# Patient Record
Sex: Male | Born: 1935
Health system: Southern US, Community
[De-identification: ages and names within clinical notes are randomized; demographics above are authoritative.]

## PROBLEM LIST (undated history)

## (undated) DIAGNOSIS — R569 Unspecified convulsions: Secondary | ICD-10-CM

## (undated) DIAGNOSIS — I69359 Hemiplegia and hemiparesis following cerebral infarction affecting unspecified side: Secondary | ICD-10-CM

## (undated) DIAGNOSIS — I639 Cerebral infarction, unspecified: Secondary | ICD-10-CM

## (undated) DIAGNOSIS — E7800 Pure hypercholesterolemia, unspecified: Secondary | ICD-10-CM

## (undated) DIAGNOSIS — E785 Hyperlipidemia, unspecified: Secondary | ICD-10-CM

## (undated) DIAGNOSIS — R269 Unspecified abnormalities of gait and mobility: Secondary | ICD-10-CM

## (undated) DIAGNOSIS — M21372 Foot drop, left foot: Secondary | ICD-10-CM

## (undated) DIAGNOSIS — G459 Transient cerebral ischemic attack, unspecified: Secondary | ICD-10-CM

## (undated) DIAGNOSIS — I69320 Aphasia following cerebral infarction: Secondary | ICD-10-CM

## (undated) DIAGNOSIS — Z8679 Personal history of other diseases of the circulatory system: Secondary | ICD-10-CM

## (undated) DIAGNOSIS — I10 Essential (primary) hypertension: Secondary | ICD-10-CM

## (undated) DIAGNOSIS — C61 Malignant neoplasm of prostate: Secondary | ICD-10-CM

## (undated) DIAGNOSIS — I69391 Dysphagia following cerebral infarction: Secondary | ICD-10-CM

## (undated) DIAGNOSIS — C801 Malignant (primary) neoplasm, unspecified: Secondary | ICD-10-CM

## (undated) HISTORY — PX: TONSILLECTOMY: SUR1361

## (undated) HISTORY — PX: OTHER SURGICAL HISTORY: SHX169

## (undated) HISTORY — DX: Hyperlipidemia, unspecified: E78.5

## (undated) HISTORY — DX: Aphasia following cerebral infarction: I69.320

## (undated) HISTORY — PX: CATARACT EXTRACTION: SUR2

## (undated) HISTORY — PX: CAROTID ENDARTERECTOMY: SUR193

## (undated) HISTORY — DX: Unspecified abnormalities of gait and mobility: R26.9

## (undated) HISTORY — PX: ABDOMINAL HERNIA REPAIR: SHX539

## (undated) HISTORY — DX: Foot drop, left foot: M21.372

## (undated) HISTORY — DX: Malignant neoplasm of prostate: C61

## (undated) HISTORY — DX: Dysphagia following cerebral infarction: I69.391

## (undated) HISTORY — DX: Malignant (primary) neoplasm, unspecified: C80.1

## (undated) HISTORY — DX: Personal history of other diseases of the circulatory system: Z86.79

## (undated) HISTORY — DX: Hemiplegia and hemiparesis following cerebral infarction affecting unspecified side: I69.359

---

## 1998-04-28 ENCOUNTER — Inpatient Hospital Stay (HOSPITAL_COMMUNITY)
Admission: RE | Admit: 1998-04-28 | Discharge: 1998-05-13 | Payer: Self-pay | Admitting: Physical Medicine & Rehabilitation

## 2007-09-06 ENCOUNTER — Ambulatory Visit: Admission: RE | Admit: 2007-09-06 | Discharge: 2007-12-05 | Payer: Self-pay | Admitting: Radiation Oncology

## 2007-10-31 ENCOUNTER — Ambulatory Visit (HOSPITAL_BASED_OUTPATIENT_CLINIC_OR_DEPARTMENT_OTHER): Admission: RE | Admit: 2007-10-31 | Discharge: 2007-10-31 | Payer: Self-pay | Admitting: Urology

## 2010-12-21 NOTE — H&P (Signed)
[name], [name]           ACCOUNT NO.:  [id number]   MEDICAL RECORD NO.:  [id number]          PATIENT TYPE:  AMB   LOCATION:  [facility]                         FACILITY:  [facility]   PHYSICIAN:  Debroah Baller, M.D.     DATE OF BIRTH:  11-01-1928   DATE OF ADMISSION:  10/31/2007  DATE OF DISCHARGE:                              HISTORY & PHYSICAL   This is for surgery March 25th. [name] is a 77 year old white  male with a history of prostate nodule, and he underwent biopsy in  December 2008. The biopsy came back positive on the left show a  Gleason's 3+3 (6 adenocarcinoma in 20% of the tissue). He has been  presented his options in terms of treatment, and he is for definitive  therapy with radioactive seed implantation. The patient underwent  staging with CT scan which did not show any spread. His past history is  significant for hypertension and elevated cholesterol. He is being  treated by Dr. Royal Hawthorn in [place]. Previous surgeries included May 2008  right carotid endarterectomy and then subsequently in July 2008 he had a  basal cell cancer removed from behind his right ear. He had a left  carotid endarterectomy in May 2006. He does not smoke. There is no  family history of prostate cancer. There is of diabetes.   MEDICATIONS:  1. Saw Palmetto.  2. Plavix.  3. Zocor.  4. Lisinopril.  5. Aspirin.  6. Fish oil.  7. Multivitamins.   ALLERGIES:  No known drug allergies.   REVIEW OF SYSTEMS:  Negative for chest pain, shortness of breath. No  dysuria. Bowel movements are regular.   PHYSICAL EXAMINATION:  GENERAL:  Well-developed, well-nourished man in  no acute distress.  VITAL SIGNS:  As recorded.  HEENT:  Head is normal and normocephalic.  NECK:  Supple with full range of motion.  HEART SOUNDS:  Regular rate and rhythm.  LUNGS:  Clear to auscultation.  BACK:  Without CVA tenderness.  ABDOMEN:  Nontender without masses.  GENITOURINARY:  Penis without lesions. Both  testes are descended without  masses. Epididymis is nontender. No external scrotal lesions.  RECTAL:  20 gram prostate with a nodule at the left base. Seminal  vesicles are not palpable. Adequate sphincter tone. No external  hemorrhoids.  EXTREMITIES:  No edema. No calf tenderness.  NEUROLOGIC:  Nonfocal.   PERTINENT LABORATORIES:  Pending at this time. His last PSA was  approximately September 2008 and was 2.73.   IMPRESSION:  Stage T2 prostate cancer.   PLAN:  Proceed with definitive therapy with radioactive seed  implantation. The patient has been explained the risks and benefits. He  understands and wishes to go forward with this.           ______________________________  Debroah Baller, M.D.     RC/MEDQ  D:  10/24/2007  T:  10/24/2007  Job:  [id number]

## 2010-12-21 NOTE — Op Note (Signed)
[name], [name]         ACCOUNT NO.:  [id number]   MEDICAL RECORD NO.:  [id number]          PATIENT TYPE:  AMB   LOCATION:  [facility]                         FACILITY:  [facility]   PHYSICIAN:  Debroah Baller, M.D.     DATE OF BIRTH:  09-Jul-1929   DATE OF PROCEDURE:  DATE OF DISCHARGE:                               OPERATIVE REPORT   PREOPERATIVE DIAGNOSIS:  Prostate cancer.   POSTOPERATIVE DIAGNOSIS:  Prostate cancer.   PROCEDURE:  Transrectal ultrasound of the prostate intraprostatic seed  implantation of I-125 radioactive seeds and cystoscopy.   SURGEONS OF RECORD:  Debroah Baller, M.D.  Maryln Gottron, M.D.   ANESTHESIA:  General.   INDICATIONS FOR PROCEDURE:  The patient is a 77 year old white male with  recent diagnosis of prostate cancer.  The patient was presented his  options and has opted for definitive therapy with radioactive seed  implantation.   PROCEDURE IN DETAIL:  The patient was brought into the operating room  and placed on the table in the supine position.  After adequate general  anesthesia was achieved, a Foley catheter was placed and the patient was  carefully placed in an exaggerated lithotomy using the yellow Fin  stirrups.  The scrotum was then shaved, prepped, and then taped onto the  lower abdomen.  A red rubber catheter was placed into the rectum to  remove excess air and a transrectal ultrasound probe was placed.  The  prostate was scanned and imaging correlated with the identification  grid.  The anchor needles were then placed into the prostate.  The  Nucletron program then had the prostatic images scanned from apex to  seminal vesicles and from lateral border to lateral border.  The  individual images were then reviewed on the computer program and by  myself, Dr. Dayton Scrape, and the physicist, the prostatic borders were  captured into the program and then the program was used to generate the  seed implantation map.  A final review of the seed  placements was done  to assure no over treatment or under treatment of areas.  Once we had  ascertained seed implantation, the procedure commenced.  Bladder neck  was identified with the Foley balloon with contrast in place.  The  needles were then placed from anterior to posterior in the prostate but  to encompass the prostate in its entirety,  they were placed in a  diffuse pattern with no portion being left untreated.  Once the final  seeds were placed, the procedure was terminated.  A pelvic x-ray was  obtained. Foley catheter was then removed and penis reprepped and  draped.  Flexible cystoscopy was performed and this showed the urethra  to be intact and there was no seeds or  spacers noted within the bladder.  Rectal exam was performed and this  did not reveal any foreign bodies in the rectum.  Using sterile  technique, a 16-French Foley catheter was then placed to drain the  bladder.  The patient had tolerated all this well.  He was then awakened  and taken to the recovery room in good condition.  ______________________________  Debroah Baller, M.D.     RC/MEDQ  D:  10/31/2007  T:  10/31/2007  Job:  [id number]   cc:   Maryln Gottron, M.D.  Fax: [phone]

## 2013-07-12 ENCOUNTER — Other Ambulatory Visit: Payer: Self-pay

## 2013-07-12 ENCOUNTER — Encounter (HOSPITAL_COMMUNITY): Payer: Self-pay | Admitting: Emergency Medicine

## 2013-07-12 ENCOUNTER — Inpatient Hospital Stay (HOSPITAL_COMMUNITY): Payer: Medicare Other

## 2013-07-12 ENCOUNTER — Inpatient Hospital Stay (HOSPITAL_COMMUNITY)
Admission: EM | Admit: 2013-07-12 | Discharge: 2013-07-13 | DRG: 069 | Disposition: A | Discharge status: Home / Self Care | Payer: Medicare Other | Attending: Internal Medicine | Admitting: Internal Medicine

## 2013-07-12 ENCOUNTER — Emergency Department (HOSPITAL_COMMUNITY): Payer: Medicare Other

## 2013-07-12 DIAGNOSIS — I959 Hypotension, unspecified: Secondary | ICD-10-CM | POA: Diagnosis present

## 2013-07-12 DIAGNOSIS — Z8673 Personal history of transient ischemic attack (TIA), and cerebral infarction without residual deficits: Secondary | ICD-10-CM

## 2013-07-12 DIAGNOSIS — R404 Transient alteration of awareness: Secondary | ICD-10-CM | POA: Diagnosis present

## 2013-07-12 DIAGNOSIS — G40309 Generalized idiopathic epilepsy and epileptic syndromes, not intractable, without status epilepticus: Secondary | ICD-10-CM | POA: Diagnosis present

## 2013-07-12 DIAGNOSIS — G8190 Hemiplegia, unspecified affecting unspecified side: Secondary | ICD-10-CM | POA: Diagnosis present

## 2013-07-12 DIAGNOSIS — R569 Unspecified convulsions: Secondary | ICD-10-CM

## 2013-07-12 DIAGNOSIS — Z66 Do not resuscitate: Secondary | ICD-10-CM | POA: Diagnosis present

## 2013-07-12 DIAGNOSIS — E785 Hyperlipidemia, unspecified: Secondary | ICD-10-CM | POA: Diagnosis present

## 2013-07-12 DIAGNOSIS — G40909 Epilepsy, unspecified, not intractable, without status epilepticus: Secondary | ICD-10-CM | POA: Diagnosis present

## 2013-07-12 DIAGNOSIS — I10 Essential (primary) hypertension: Secondary | ICD-10-CM | POA: Diagnosis present

## 2013-07-12 DIAGNOSIS — R072 Precordial pain: Secondary | ICD-10-CM

## 2013-07-12 DIAGNOSIS — E7800 Pure hypercholesterolemia, unspecified: Secondary | ICD-10-CM | POA: Diagnosis present

## 2013-07-12 DIAGNOSIS — G459 Transient cerebral ischemic attack, unspecified: Principal | ICD-10-CM | POA: Diagnosis present

## 2013-07-12 DIAGNOSIS — Z79899 Other long term (current) drug therapy: Secondary | ICD-10-CM

## 2013-07-12 DIAGNOSIS — I639 Cerebral infarction, unspecified: Secondary | ICD-10-CM

## 2013-07-12 HISTORY — DX: Unspecified convulsions: R56.9

## 2013-07-12 HISTORY — DX: Pure hypercholesterolemia, unspecified: E78.00

## 2013-07-12 HISTORY — DX: Essential (primary) hypertension: I10

## 2013-07-12 HISTORY — DX: Cerebral infarction, unspecified: I63.9

## 2013-07-12 HISTORY — DX: Transient cerebral ischemic attack, unspecified: G45.9

## 2013-07-12 LAB — PROTIME-INR
INR: 0.98 (ref 0.00–1.49)
Prothrombin Time: 12.8 seconds (ref 11.6–15.2)

## 2013-07-12 LAB — POCT I-STAT, CHEM 8
BUN: 19 mg/dL (ref 6–23)
Calcium, Ion: 1.21 mmol/L (ref 1.13–1.30)
Chloride: 100 mEq/L (ref 96–112)
Creatinine, Ser: 1 mg/dL (ref 0.50–1.35)

## 2013-07-12 LAB — APTT: aPTT: 38 seconds — ABNORMAL HIGH (ref 24–37)

## 2013-07-12 LAB — COMPREHENSIVE METABOLIC PANEL
ALT: 15 U/L (ref 0–53)
AST: 22 U/L (ref 0–37)
Albumin: 4 g/dL (ref 3.5–5.2)
Alkaline Phosphatase: 84 U/L (ref 39–117)
BUN: 18 mg/dL (ref 6–23)
Potassium: 4.7 mEq/L (ref 3.5–5.1)
Sodium: 134 mEq/L — ABNORMAL LOW (ref 135–145)
Total Protein: 7.4 g/dL (ref 6.0–8.3)

## 2013-07-12 LAB — CBC
MCH: 32.8 pg (ref 26.0–34.0)
MCHC: 35.2 g/dL (ref 30.0–36.0)
Platelets: 251 10*3/uL (ref 150–400)
RDW: 12.3 % (ref 11.5–15.5)

## 2013-07-12 LAB — MRSA PCR SCREENING: MRSA by PCR: NEGATIVE

## 2013-07-12 LAB — DIFFERENTIAL
Basophils Absolute: 0 10*3/uL (ref 0.0–0.1)
Basophils Relative: 1 % (ref 0–1)
Eosinophils Absolute: 0.1 10*3/uL (ref 0.0–0.7)
Neutro Abs: 3.8 10*3/uL (ref 1.7–7.7)
Neutrophils Relative %: 64 % (ref 43–77)

## 2013-07-12 LAB — TROPONIN I: Troponin I: <0.3 ng/mL (ref <0.30)

## 2013-07-12 LAB — GLUCOSE, CAPILLARY
Glucose-Capillary: 125 mg/dL — ABNORMAL HIGH (ref 70–99)
Glucose-Capillary: 118 mg/dL — ABNORMAL HIGH (ref 70–99)

## 2013-07-12 MED ORDER — LORAZEPAM 2 MG/ML IJ SOLN
2.0000 mg | Freq: Once | INTRAMUSCULAR | Status: AC
  Administered 2013-07-12: 2 mg via INTRAVENOUS
  Filled 2013-07-12: qty 1

## 2013-07-12 MED ORDER — ONDANSETRON HCL 4 MG PO TABS: 4.0000 mg | ORAL_TABLET | Freq: Four times a day (QID) | ORAL | Status: DC | PRN

## 2013-07-12 MED ORDER — ONDANSETRON HCL 4 MG/2ML IJ SOLN: 4.0000 mg | Freq: Four times a day (QID) | INTRAMUSCULAR | Status: DC | PRN

## 2013-07-12 MED ORDER — INSULIN ASPART 100 UNIT/ML ~~LOC~~ SOLN: 0.0000 [IU] | Freq: Three times a day (TID) | SUBCUTANEOUS | Status: DC

## 2013-07-12 MED ORDER — SODIUM CHLORIDE 0.9 % IV SOLN
500.0000 mg | Freq: Two times a day (BID) | INTRAVENOUS | Status: DC
  Administered 2013-07-12: 500 mg via INTRAVENOUS
  Filled 2013-07-12 (×3): qty 5

## 2013-07-12 MED ORDER — SODIUM CHLORIDE 0.9 % IV SOLN
INTRAVENOUS | Status: DC
  Administered 2013-07-12: 12:00:00 via INTRAVENOUS
  Administered 2013-07-12 – 2013-07-13 (×2): 1000 mL via INTRAVENOUS

## 2013-07-12 MED ORDER — GUAIFENESIN-DM 100-10 MG/5ML PO SYRP
5.0000 mL | ORAL_SOLUTION | ORAL | Status: DC | PRN
  Filled 2013-07-12: qty 5

## 2013-07-12 MED ORDER — POLYETHYLENE GLYCOL 3350 17 G PO PACK
17.0000 g | PACK | Freq: Every day | ORAL | Status: DC | PRN
  Filled 2013-07-12: qty 1

## 2013-07-12 MED ORDER — ALBUTEROL SULFATE (5 MG/ML) 0.5% IN NEBU: 2.5000 mg | INHALATION_SOLUTION | RESPIRATORY_TRACT | Status: DC | PRN

## 2013-07-12 MED ORDER — HEPARIN SODIUM (PORCINE) 5000 UNIT/ML IJ SOLN
5000.0000 [IU] | Freq: Three times a day (TID) | INTRAMUSCULAR | Status: DC
  Administered 2013-07-12 – 2013-07-13 (×4): 5000 [IU] via SUBCUTANEOUS
  Filled 2013-07-12 (×6): qty 1

## 2013-07-12 MED ORDER — ASPIRIN EC 81 MG PO TBEC
81.0000 mg | DELAYED_RELEASE_TABLET | Freq: Every day | ORAL | Status: DC
  Administered 2013-07-12 – 2013-07-13 (×2): 81 mg via ORAL
  Filled 2013-07-12 (×3): qty 1

## 2013-07-12 MED ORDER — SODIUM CHLORIDE 0.9 % IV SOLN
1000.0000 mg | Freq: Once | INTRAVENOUS | Status: AC
  Administered 2013-07-12: 1000 mg via INTRAVENOUS
  Filled 2013-07-12: qty 10

## 2013-07-12 MED ORDER — SODIUM CHLORIDE 0.9 % IV SOLN
Freq: Once | INTRAVENOUS | Status: AC
  Administered 2013-07-12: 11:00:00 via INTRAVENOUS

## 2013-07-12 MED ORDER — SODIUM CHLORIDE 0.9 % IJ SOLN
3.0000 mL | Freq: Two times a day (BID) | INTRAMUSCULAR | Status: DC
  Administered 2013-07-12: 3 mL via INTRAVENOUS

## 2013-07-12 NOTE — ED Notes (Signed)
Per EMS, pt last seen well by spouse at 0730 this am. Symptoms of left sided weakness, facial droop, slurred speech and tremors noticed at 0830. Upon EMS arrival, symptoms still present. Tremors and left sided weakness noted upon ED arrival.

## 2013-07-12 NOTE — ED Notes (Signed)
Admitting MD at bedside.

## 2013-07-12 NOTE — ED Provider Notes (Signed)
CSN: [id number]     Arrival date & time 07/12/13  1009 History   First MD Initiated Contact with Patient 07/12/13 1022     Chief Complaint  Patient presents with  . Code Stroke   (Consider location/radiation/quality/duration/timing/severity/associated sxs/prior Treatment) HPI Comments: 79 yo wm presents to ER with cc of stroke.  Pt t/s to ER via EMS.  Report is pt awoke at 0730 this AM and was at baseline.  At 0830 he developed L sided weakness to UE and LE and developed L sided twitching of face, LUE, and LLE.   He denies HA, CP, SOB, n/v/d, abd pain.    Patient is a 79 y.o. male presenting with Acute Neurological Problem. The history is provided by the patient and the EMS personnel. The history is limited by the condition of the patient.  Cerebrovascular Accident This is a new problem. The current episode started 1 to 2 hours ago. The problem occurs constantly. The problem has not changed since onset.Pertinent negatives include no chest pain, no abdominal pain, no headaches and no shortness of breath. Nothing relieves the symptoms. He has tried nothing for the symptoms.    Past Medical History  Diagnosis Date  . Stroke     04/20/1998  . TIA (transient ischemic attack)   . Seizures     05/2013  . Hypertension   . High cholesterol    History reviewed. No pertinent past surgical history. Family History  Problem Relation Age of Onset  . Heart failure Brother   . Heart failure Mother    History  Substance Use Topics  . Smoking status: Never Smoker   . Smokeless tobacco: Not on file  . Alcohol Use: No    Review of Systems  Respiratory: Negative for shortness of breath.   Cardiovascular: Negative for chest pain.  Gastrointestinal: Negative for abdominal pain.  Neurological: Negative for headaches.    Allergies  Review of patient's allergies indicates not on file.  Home Medications  No current outpatient prescriptions on file. BP 146/73  Pulse 69  Temp(Src) 97.4 F  (36.3 C) (Oral)  Resp 18  Ht 5\' 10"  (1.778 m)  Wt 166 lb 14.2 oz (75.7 kg)  BMI 23.95 kg/m2  SpO2 100% Physical Exam  Nursing note and vitals reviewed. Constitutional: He is oriented to person, place, and time. He appears well-developed. He appears distressed.  HENT:  Head: Normocephalic.  Mouth/Throat: Oropharynx is clear and moist.  Eyes: Conjunctivae and EOM are normal. Right eye exhibits no discharge. Left eye exhibits no discharge.  Neck: Normal range of motion. Neck supple.  Patient has his head turned to the left and difficulty turning it to the right  Cardiovascular: Normal rate and regular rhythm.  Exam reveals no gallop.   Murmur heard. Pulmonary/Chest: Effort normal and breath sounds normal. He has no wheezes. He has no rales.  Abdominal: Soft. He exhibits no mass. There is no tenderness. There is no rebound and no guarding.  Musculoskeletal: Normal range of motion. He exhibits no edema and no tenderness.  Neurological: He is alert and oriented to person, place, and time. He displays tremor. He exhibits abnormal muscle tone. He displays seizure activity. GCS eye subscore is 4. GCS verbal subscore is 5. GCS motor subscore is 4.  Patient presents with left upper extremity, left lower extremity, left face twitching and rhythmic pattern. He is alert and can answer questions. Patient has weakness of left upper and lower extremity and is unable to coordinate purposeful movement.  For thorough neurologic evaluation please see neurology note.  Left-sided twitching thought to be focal seizure which progressed to grand mal seizure at approximately 1020.    ED Course  Procedures (including critical care time) Labs Review Labs Reviewed  APTT - Abnormal; Notable for the following:    aPTT 38 (*)    All other components within normal limits  COMPREHENSIVE METABOLIC PANEL - Abnormal; Notable for the following:    Sodium 134 (*)    Glucose, Bld 101 (*)    GFR calc non Af Amer 77 (*)     GFR calc Af Amer 89 (*)    All other components within normal limits  GLUCOSE, CAPILLARY - Abnormal; Notable for the following:    Glucose-Capillary 125 (*)    All other components within normal limits  POCT I-STAT, CHEM 8 - Abnormal; Notable for the following:    Glucose, Bld 106 (*)    All other components within normal limits  MRSA PCR SCREENING  PROTIME-INR  CBC  DIFFERENTIAL  TROPONIN I  GLUCOSE, CAPILLARY  HEMOGLOBIN A1C  LIPID PANEL  BASIC METABOLIC PANEL  CBC   Imaging Review Ct Head (brain) Wo Contrast  07/12/2013   CLINICAL DATA:  Left-sided facial droop, slurred speech  EXAM: CT HEAD WITHOUT CONTRAST  TECHNIQUE: Contiguous axial images were obtained from the base of the skull through the vertex without intravenous contrast.  COMPARISON:  05/01/2013  FINDINGS: A region of encephalomalacic change projects within the right frontoparietal region consistent an area of chronic MCA distribution infarction. There is diffuse cortical atrophy and areas of low attenuation within the subcortical, deep, and periventricular white matter regions. There is no evidence of acute hemorrhage nor mass effect. Hydrocephalus ex vacuo is appreciated. Punctate area of low attenuation projects along the peripheral base of the left lobe of the cerebellum. An area of low attenuation projects within the base of the right lentiform nucleus region. There is no evidence of a depressed skull fracture. Visualized paranasal sinuses and mastoid air cells are patent.  IMPRESSION: 1. Encephalomalacia change in the right frontoparietal region indicative of an area of prior MCA distribution infarction 2. Chronic and involutional changes with areas of lacunar infarction, chronic base of the right basal ganglia and left cerebellar hemisphere. 3. No evidence of acute abnormalities 4. Dr. Cyril Mourning was informed of these findings via telephone conversation at the time of the interpretation.   Electronically Signed   By: Salome Holmes M.D.   On: 07/12/2013 10:33   Dg Chest Port 1 View  07/12/2013   CLINICAL DATA:  Cough; unresponsive  EXAM: PORTABLE CHEST - 1 VIEW  COMPARISON:  May 01, 2013  FINDINGS: There is no edema or consolidation. Heart size and pulmonary vascularity are normal. No adenopathy. There is atherosclerotic change in the aorta. There is arthropathy in both shoulders.  IMPRESSION: No edema or consolidation.   Electronically Signed   By: Bretta Bang M.D.   On: 07/12/2013 14:01    EKG Interpretation    Date/Time:  Friday July 12 2013 10:30:46 EST Ventricular Rate:  88 PR Interval:  257 QRS Duration: 108 QT Interval:  330 QTC Calculation: 399 R Axis:   44 Text Interpretation:  Sinus rhythm Prolonged PR interval Confirmed by Kirston Luty  MD, Tywanda Rice ([id number]) on 07/12/2013 11:55:30 AM           Results for orders placed during the hospital encounter of 07/12/13  MRSA PCR SCREENING      Result Value Range  MRSA by PCR NEGATIVE  NEGATIVE  PROTIME-INR      Result Value Range   Prothrombin Time 12.8  11.6 - 15.2 seconds   INR 0.98  0.00 - 1.49  APTT      Result Value Range   aPTT 38 (*) 24 - 37 seconds  CBC      Result Value Range   WBC 5.9  4.0 - 10.5 K/uL   RBC 4.51  4.22 - 5.81 MIL/uL   Hemoglobin 14.8  13.0 - 17.0 g/dL   HCT 16.1  09.6 - 04.5 %   MCV 93.3  78.0 - 100.0 fL   MCH 32.8  26.0 - 34.0 pg   MCHC 35.2  30.0 - 36.0 g/dL   RDW 40.9  81.1 - 91.4 %   Platelets 251  150 - 400 K/uL  DIFFERENTIAL      Result Value Range   Neutrophils Relative % 64  43 - 77 %   Neutro Abs 3.8  1.7 - 7.7 K/uL   Lymphocytes Relative 25  12 - 46 %   Lymphs Abs 1.5  0.7 - 4.0 K/uL   Monocytes Relative 8  3 - 12 %   Monocytes Absolute 0.5  0.1 - 1.0 K/uL   Eosinophils Relative 2  0 - 5 %   Eosinophils Absolute 0.1  0.0 - 0.7 K/uL   Basophils Relative 1  0 - 1 %   Basophils Absolute 0.0  0.0 - 0.1 K/uL  COMPREHENSIVE METABOLIC PANEL      Result Value Range   Sodium 134 (*) 135 - 145  mEq/L   Potassium 4.7  3.5 - 5.1 mEq/L   Chloride 98  96 - 112 mEq/L   CO2 26  19 - 32 mEq/L   Glucose, Bld 101 (*) 70 - 99 mg/dL   BUN 18  6 - 23 mg/dL   Creatinine, Ser 7.82  0.50 - 1.35 mg/dL   Calcium 9.7  8.4 - 95.6 mg/dL   Total Protein 7.4  6.0 - 8.3 g/dL   Albumin 4.0  3.5 - 5.2 g/dL   AST 22  0 - 37 U/L   ALT 15  0 - 53 U/L   Alkaline Phosphatase 84  39 - 117 U/L   Total Bilirubin 0.5  0.3 - 1.2 mg/dL   GFR calc non Af Amer 77 (*) >90 mL/min   GFR calc Af Amer 89 (*) >90 mL/min  TROPONIN I      Result Value Range   Troponin I <0.30  <0.30 ng/mL  GLUCOSE, CAPILLARY      Result Value Range   Glucose-Capillary 125 (*) 70 - 99 mg/dL  GLUCOSE, CAPILLARY      Result Value Range   Glucose-Capillary 86  70 - 99 mg/dL  POCT I-STAT, CHEM 8      Result Value Range   Sodium 136  135 - 145 mEq/L   Potassium 4.7  3.5 - 5.1 mEq/L   Chloride 100  96 - 112 mEq/L   BUN 19  6 - 23 mg/dL   Creatinine, Ser 2.13  0.50 - 1.35 mg/dL   Glucose, Bld 086 (*) 70 - 99 mg/dL   Calcium, Ion 5.78  4.69 - 1.30 mmol/L   TCO2 25  0 - 100 mmol/L   Hemoglobin 15.0  13.0 - 17.0 g/dL   HCT 62.9  52.8 - 41.3 %    MDM   1. Seizure   2. H/O: stroke  3. Stroke   4. TIA (transient ischemic attack)   5. Seizures   6. Hypertension   7. High cholesterol    79 yo wm presents via EMS as a code stroke.  Neuro (Dr. Cyril Mourning) at bedside in room 31 after CT.   L sided facial, LUE, LLE twitching noted.    At 1020 L sided twitching progressed to [facility] seizure.  Pt given Ativan 2mg  IV which aborted seizure and loaded with keppra per neurology recommendations.      6:55 PM D/w Critical Care attending who recommended weaning O2 and consult to triad for admission.    CRITICAL CARE Performed by: Redgie Grayer, Gracyn Allor   Total critical care time: 45  Critical care time was exclusive of separately billable procedures and treating other patients.  Critical care was necessary to treat or prevent imminent or  life-threatening deterioration.  Critical care was time spent personally by me on the following activities: development of treatment plan with patient and/or surrogate as well as nursing, discussions with consultants, evaluation of patient's response to treatment, examination of patient, obtaining history from patient or surrogate, ordering and performing treatments and interventions, ordering and review of laboratory studies, ordering and review of radiographic studies, pulse oximetry and re-evaluation of patient's condition.  Case discussed at length with neurology, critical care, and hospitalist.  Pt admitted to hospitalist.  Family updated.  VSS.  Pt had a brief period of hypotension after Keppra given, but this was transient and improved after IVF.     Darlys Gales, MD 07/12/13 [phone]

## 2013-07-12 NOTE — Progress Notes (Signed)
  Echocardiogram 2D Echocardiogram has been performed.  [name] 07/12/2013, 4:13 PM

## 2013-07-12 NOTE — H&P (Signed)
[facility]                                                                                    Patient Demographics  [name], is a 79 y.o. male  MRN: [id number]   DOB - Dec 24, 1928  Admit Date - 07/12/2013  Outpatient Primary MD for the patient is No primary provider on file.   With History of -  Past Medical History  Diagnosis Date  . Stroke     04/20/1998  . TIA (transient ischemic attack)   . Seizures     05/2013  . Hypertension   . High cholesterol       History reviewed. No pertinent past surgical history.  in for   Chief Complaint  Patient presents with  . Code Stroke     HPI  [name]  is a 79 y.o. 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 He was seen in the ER by neurologist on call Dr. Cyril Mourning , patient received some Ativan and Keppra in the ER thereafter became somnolent and unarousable, blood pressure temporarily dropped due to Ativan which came up with IV fluids. Stroke was ruled out and I was called to admit the patient for seizure workup and hypertension.    Review of Systems   Unobtainable patient is somnolent after Ativan and sleeping remains unarousable right now.   Social History History  Substance Use Topics  . Smoking status: Never Smoker   . Smokeless tobacco: Not on file  . Alcohol Use: No      Family History Family History  Problem Relation Age of Onset  . Heart failure Brother   . Heart failure Mother       Prior  to Admission medications   Not on File    Not on File  Physical Exam  Vitals  Blood pressure 100/53, pulse 67, temperature 98.3 F (36.8 C), temperature source Oral, resp. rate 14, SpO2 94.00%.   1. General eldely white male lying in bed somnolent and sleep and after Ativan  2. chronic left-sided hemiparesis but moves left-sided 20s to painful stimuli, moves right-sided extremities to painful stimuli more briskly,  3. No F.N deficits, ALL C.Nerves Intact, Strength 5/5 all 4 extremities, Sensation intact all 4 extremities, Plantars down going.  4. Ears and Eyes appear Normal, Conjunctivae clear, PERRLA. Moist Oral Mucosa.  5. Supple Neck, No JVD, No cervical lymphadenopathy appriciated, No Carotid Bruits.  6. Symmetrical Chest wall movement, Good air movement bilaterally, CTAB.  7. RRR, No Gallops, Rubs or Murmurs, No Parasternal Heave.  8. Positive Bowel Sounds, Abdomen Soft, Non tender, No organomegaly appriciated,No rebound -guarding or rigidity.  9.  No Cyanosis, Normal Skin Turgor,  No Skin Rash or Bruise.  10. Good muscle tone,  joints appear normal , no effusions, Normal ROM.  11. No Palpable Lymph Nodes in Neck or Axillae     Data Review  CBC  Recent Labs Lab 07/12/13 1010 07/12/13 1018  WBC 5.9  --   HGB 14.8 15.0  HCT 42.1 44.0  PLT 251  --   MCV 93.3  --   MCH 32.8  --   MCHC 35.2  --   RDW 12.3  --   LYMPHSABS 1.5  --   MONOABS 0.5  --   EOSABS 0.1  --   BASOSABS 0.0  --    ------------------------------------------------------------------------------------------------------------------  Chemistries   Recent Labs Lab 07/12/13 1010 07/12/13 1018  NA 134* 136  K 4.7 4.7  CL 98 100  CO2 26  --   GLUCOSE 101* 106*  BUN 18 19  CREATININE 0.90 1.00  CALCIUM 9.7  --   AST 22  --   ALT 15  --   ALKPHOS 84  --   BILITOT 0.5  --     ------------------------------------------------------------------------------------------------------------------ CrCl is unknown because there is no height on file for the current visit. ------------------------------------------------------------------------------------------------------------------ No results found for this basename: TSH, T4TOTAL, FREET3, T3FREE, THYROIDAB,  in the last 72 hours   Coagulation profile  Recent Labs Lab 07/12/13 1010  INR 0.98   ------------------------------------------------------------------------------------------------------------------- No results found for this basename: DDIMER,  in the last 72 hours -------------------------------------------------------------------------------------------------------------------  Cardiac Enzymes  Recent Labs Lab 07/12/13 1010  TROPONINI <0.30   ------------------------------------------------------------------------------------------------------------------ No components found with this basename: POCBNP,    ---------------------------------------------------------------------------------------------------------------  Urinalysis No results found for this basename: colorurine, appearanceur, labspec, phurine, glucoseu, hgbur, bilirubinur, ketonesur, proteinur, urobilinogen, nitrite, leukocytesur    ----------------------------------------------------------------------------------------------------------------  Imaging results:   Ct Head (brain) Wo Contrast  07/12/2013   CLINICAL DATA:  Left-sided facial droop, slurred speech  EXAM: CT HEAD WITHOUT CONTRAST  TECHNIQUE: Contiguous axial images were obtained from the base of the skull through the vertex without intravenous contrast.  COMPARISON:  05/01/2013  FINDINGS: A region of encephalomalacic change projects within the right frontoparietal region consistent an area of chronic MCA distribution infarction. There is diffuse cortical atrophy and areas  of low attenuation within the subcortical, deep, and periventricular white matter regions. There is no evidence of acute hemorrhage nor mass effect. Hydrocephalus ex vacuo is appreciated. Punctate area of low attenuation projects along the peripheral base of the left lobe of the cerebellum. An area of low attenuation projects within the base of the right lentiform nucleus region. There is no evidence of a depressed skull fracture. Visualized paranasal sinuses and mastoid air cells are patent.  IMPRESSION: 1. Encephalomalacia change in the right frontoparietal region indicative of an area of prior MCA distribution infarction 2. Chronic and involutional changes with areas of lacunar infarction, chronic base of the right basal ganglia and left cerebellar hemisphere. 3. No evidence of acute abnormalities 4. Dr. Cyril Mourning was informed of these findings via telephone conversation at the time of the interpretation.   Electronically Signed   By: Salome Holmes M.D.   On: 07/12/2013 10:33    My personal review of EKG: Rhythm NSR,  no Acute ST changes    Assessment & Plan    1. Recurrent seizure activity (at least second episode in the last few months) - who has had a large right MCA territory ischemic CVA in the past and was not in any seizure medications, we will do  to step down, frequent neurochecks, repeat MRI to rule out another CVA, EEG, Keppra low given in the ER and will be continued on a daily basis. Neuro following. Seizure and aspiration precautions.   2. Somnolence and hypotension after Ativan in the ER. IV fluid bolus then maintenance, blood pressure is improved, elevate head of the bed, aspiration precautions for now, once wakes up swallow eval and then advance diet as tolerated. Supportive care for now. Avoid future Ativan.   3. Hypertension and dyslipidemia. Home medications currently unknown, pharmacy to reconsult. We'll monitor for now.    4. Questionable history of type 2 diabetes  mellitus. We'll check A1c, sliding scale insulin for now.   DVT Prophylaxis Heparin   AM Labs Ordered, also please review Full Orders  Family Communication: Admission, patients condition and plan of care including tests being ordered have been discussed with the patient and wife who indicate understanding and agree with the plan and Code Status.  Code Status DNR  Likely DC to  TBD  Condition GUARDED   Time spent in minutes : 45    SINGH,PRASHANT K M.D on 07/12/2013 at 11:49 AM  Between 7am to 7pm - Pager - [phone]  After 7pm go to [email or web address] - password [email or web address]  And look for the night coverage person covering me after hours  [facility] Office  [phone]

## 2013-07-12 NOTE — ED Notes (Signed)
Code Stroke Cancelled

## 2013-07-12 NOTE — Code Documentation (Addendum)
79yo male arriving to [facility] via [facility].  EMS reports the patient woke up this morning at 0730 at his baseline and was witnessed to be well by his wife.   At 0830 he had sudden onset tremors as well as left sided weakness, facial droop and slurred speech.  Patient with seizure like movements in left face, arm and leg on arrival.  EDP assessed patient at the bridge and patient taken to CT.  CT completed.  Patient back to room, Dr. Leroy Kennedy at bedside and order given for Ativan 2mg  IVP.  Patient following commands and responding appropriately to questions during assessment.  While ED RN preparing medication, patient had a grand mal seizure and Ativan 2mg  IVP given.  Patient placed on NRB d/t decreased oxygen saturations.  Seizure subsequently stopped, oxygen saturations improved.  Order for Keppra 1gm IV per Dr. Leroy Kennedy, pharmacy made aware that medication needed STAT.  NIHSS not completed d/t medication.  Code stroke cancelled at 1033 per Dr. Leroy Kennedy.  Bedside handoff with ED RN Joni Reining.

## 2013-07-12 NOTE — ED Notes (Signed)
NPA removed after consulting with Dr. Redgie Grayer. O2 sats remain 97% on 2L

## 2013-07-12 NOTE — Progress Notes (Signed)
NEURO HOSPITALIST CONSULT NOTE    Reason for Consult: left sided tremors/twitching.  HPI:                                                                                                                                          [name] is an 79 y.o. male with a past medical history significant for right cortical infarct that was followed by an isolated seizure later on, brought to [facility] ED by ambulance as a code stroke due to acute onset of the above stated symptoms. As per EMS report, patient woke up at 730 this morning and was doing well, but around 830 am his wife noticed droopiness of the left face, then the left arm-face and leg started twitching. Upon arrival to the ED he was still having jerking movements of the left side including left side of his face. CT brain showed no acue abnormality. While in the ED and before receiving 2 mg IV ativan, the left focal motor movements progressed to a generalized convulsion that stopped after administration of IV ativan and 1 gram IV keppra.    No past medical history on file.  No past surgical history on file.  No family history on file.  Social History:  has no tobacco, alcohol, and drug history on file.  Allergies not on file  MEDICATIONS:                                                                                                                     Prior to Admission:  (Not in a hospital admission)   ROS:  UNABLE TO OBTAIN DUE TO MENTAL STATUS.  History obtained from: EMS.  There were no vitals taken for this visit.   Neurologic Examination:                                                                                                      S/p ativan Mental status: sedated now, but before ativan he was able to follow commands appropriately. CN 2-12: pupils 2 mm  bilaterally, reactive to light. No gaze preference. EOM full without nystagmus. Face appears to be symmetric and tongue is midline. Motor: unreliable s/p ativan. Sensory: no tested. Coordination and gait: unable to test. Gait: can not be tested. No meningeal signs.  No results found for this basename: cbc, bmp, coags, chol, tri, ldl, hga1c    Results for orders placed during the hospital encounter of 07/12/13 (from the past 48 hour(s))  CBC     Status: None   Collection Time    07/12/13 10:10 AM      Result Value Range   WBC 5.9  4.0 - 10.5 K/uL   RBC 4.51  4.22 - 5.81 MIL/uL   Hemoglobin 14.8  13.0 - 17.0 g/dL   HCT 16.1  09.6 - 04.5 %   MCV 93.3  78.0 - 100.0 fL   MCH 32.8  26.0 - 34.0 pg   MCHC 35.2  30.0 - 36.0 g/dL   RDW 40.9  81.1 - 91.4 %   Platelets 251  150 - 400 K/uL  DIFFERENTIAL     Status: None   Collection Time    07/12/13 10:10 AM      Result Value Range   Neutrophils Relative % 64  43 - 77 %   Neutro Abs 3.8  1.7 - 7.7 K/uL   Lymphocytes Relative 25  12 - 46 %   Lymphs Abs 1.5  0.7 - 4.0 K/uL   Monocytes Relative 8  3 - 12 %   Monocytes Absolute 0.5  0.1 - 1.0 K/uL   Eosinophils Relative 2  0 - 5 %   Eosinophils Absolute 0.1  0.0 - 0.7 K/uL   Basophils Relative 1  0 - 1 %   Basophils Absolute 0.0  0.0 - 0.1 K/uL  POCT I-STAT, CHEM 8     Status: Abnormal   Collection Time    07/12/13 10:18 AM      Result Value Range   Sodium 136  135 - 145 mEq/L   Potassium 4.7  3.5 - 5.1 mEq/L   Chloride 100  96 - 112 mEq/L   BUN 19  6 - 23 mg/dL   Creatinine, Ser 7.82  0.50 - 1.35 mg/dL   Glucose, Bld 956 (*) 70 - 99 mg/dL   Calcium, Ion 2.13  0.86 - 1.30 mmol/L   TCO2 25  0 - 100 mmol/L   Hemoglobin 15.0  13.0 - 17.0 g/dL   HCT 57.8  46.9 - 62.9 %    Ct Head (brain) Wo Contrast  07/12/2013   CLINICAL DATA:  Left-sided facial droop, slurred speech  EXAM: CT HEAD WITHOUT CONTRAST  TECHNIQUE: Contiguous axial images  were obtained from the base of the skull through  the vertex without intravenous contrast.  COMPARISON:  05/01/2013  FINDINGS: A region of encephalomalacic change projects within the right frontoparietal region consistent an area of chronic MCA distribution infarction. There is diffuse cortical atrophy and areas of low attenuation within the subcortical, deep, and periventricular white matter regions. There is no evidence of acute hemorrhage nor mass effect. Hydrocephalus ex vacuo is appreciated. Punctate area of low attenuation projects along the peripheral base of the left lobe of the cerebellum. An area of low attenuation projects within the base of the right lentiform nucleus region. There is no evidence of a depressed skull fracture. Visualized paranasal sinuses and mastoid air cells are patent.  IMPRESSION: 1. Encephalomalacia change in the right frontoparietal region indicative of an area of prior MCA distribution infarction 2. Chronic and involutional changes with areas of lacunar infarction, chronic base of the right basal ganglia and left cerebellar hemisphere. 3. No evidence of acute abnormalities 4. Dr. Cyril Mourning was informed of these findings via telephone conversation at the time of the interpretation.   Electronically Signed   By: Salome Holmes M.D.   On: 07/12/2013 10:33   Assessment/Plan: 79 y/o brought in with a left focal motor seizure with subsequent generalization. Prior right cortical infarct most likely the culprit, but can not entirely exclude early seizures in the context of new stroke. Recommend: 1) Keppra 500 mg IV BID. 2) EEG. 3) MRI brain. Will follow up.  Wyatt Portela, MD 07/12/2013, 10:38 AM

## 2013-07-12 NOTE — Progress Notes (Signed)
EEG Completed; Results Pending  

## 2013-07-13 ENCOUNTER — Inpatient Hospital Stay (HOSPITAL_COMMUNITY): Payer: Medicare Other

## 2013-07-13 DIAGNOSIS — E7800 Pure hypercholesterolemia, unspecified: Secondary | ICD-10-CM

## 2013-07-13 DIAGNOSIS — I10 Essential (primary) hypertension: Secondary | ICD-10-CM

## 2013-07-13 LAB — LIPID PANEL
Cholesterol: 155 mg/dL (ref 0–200)
HDL: 51 mg/dL (ref >39)
Triglycerides: 91 mg/dL (ref <150)
VLDL: 18 mg/dL (ref 0–40)

## 2013-07-13 LAB — CBC
Hemoglobin: 12.9 g/dL — ABNORMAL LOW (ref 13.0–17.0)
MCHC: 34.9 g/dL (ref 30.0–36.0)
MCV: 93.2 fL (ref 78.0–100.0)
Platelets: 215 10*3/uL (ref 150–400)
RDW: 12.3 % (ref 11.5–15.5)
WBC: 7.3 10*3/uL (ref 4.0–10.5)

## 2013-07-13 LAB — GLUCOSE, CAPILLARY
Glucose-Capillary: 100 mg/dL — ABNORMAL HIGH (ref 70–99)
Glucose-Capillary: 83 mg/dL (ref 70–99)

## 2013-07-13 LAB — BASIC METABOLIC PANEL
BUN: 13 mg/dL (ref 6–23)
Chloride: 103 mEq/L (ref 96–112)
Creatinine, Ser: 0.79 mg/dL (ref 0.50–1.35)
GFR calc Af Amer: >90 mL/min (ref >90)
Glucose, Bld: 92 mg/dL (ref 70–99)
Potassium: 3.6 mEq/L (ref 3.5–5.1)

## 2013-07-13 LAB — HEMOGLOBIN A1C: Mean Plasma Glucose: 120 mg/dL — ABNORMAL HIGH (ref <117)

## 2013-07-13 MED ORDER — FINASTERIDE 5 MG PO TABS
5.0000 mg | ORAL_TABLET | Freq: Every day | ORAL | Status: DC
  Administered 2013-07-13: 5 mg via ORAL
  Filled 2013-07-13: qty 1

## 2013-07-13 MED ORDER — SIMVASTATIN 40 MG PO TABS
40.0000 mg | ORAL_TABLET | Freq: Every day | ORAL | Status: DC
  Administered 2013-07-13: 40 mg via ORAL
  Filled 2013-07-13: qty 1

## 2013-07-13 MED ORDER — CLOPIDOGREL BISULFATE 75 MG PO TABS
75.0000 mg | ORAL_TABLET | Freq: Every day | ORAL | Status: DC
  Filled 2013-07-13: qty 1

## 2013-07-13 MED ORDER — LEVETIRACETAM 500 MG PO TABS
500.0000 mg | ORAL_TABLET | Freq: Two times a day (BID) | ORAL | Status: DC
  Administered 2013-07-13: 500 mg via ORAL
  Filled 2013-07-13 (×2): qty 1

## 2013-07-13 MED ORDER — LEVETIRACETAM 500 MG PO TABS: 500.0000 mg | ORAL_TABLET | Freq: Two times a day (BID) | ORAL | Status: DC

## 2013-07-13 MED ORDER — LOSARTAN POTASSIUM 25 MG PO TABS
25.0000 mg | ORAL_TABLET | Freq: Every day | ORAL | Status: DC
  Administered 2013-07-13: 25 mg via ORAL
  Filled 2013-07-13: qty 1

## 2013-07-13 MED ORDER — PANTOPRAZOLE SODIUM 40 MG PO TBEC
40.0000 mg | DELAYED_RELEASE_TABLET | Freq: Every day | ORAL | Status: DC
  Administered 2013-07-13: 40 mg via ORAL
  Filled 2013-07-13: qty 1

## 2013-07-13 NOTE — Discharge Summary (Signed)
[facility]                                                                                   [name], is a 79 y.o. male  DOB Sep 20, 1928  MRN [id number].  Admission date:  07/12/2013  Admitting Physician  Leroy Sea, MD  Discharge Date:  07/13/2013   Primary MD  No primary provider on file.  Recommendations for primary care physician for things to follow:   Follow clinically and make sure patient follows with recommended neurology group   Admission Diagnosis  TIA (transient ischemic attack) [435.9] Seizure [780.39] Seizures [780.39] Hypertension [401.9] High cholesterol [272.0] Stroke [434.91] H/O: stroke [V12.54]  Discharge Diagnosis grand mal  seizure  Active Problems:   Stroke   TIA (transient ischemic attack)   Seizures   Hypertension   High cholesterol   Seizure      Past Medical History  Diagnosis Date  . Stroke     04/20/1998  . TIA (transient ischemic attack)   . Seizures     05/2013  . Hypertension   . High cholesterol     History reviewed. No pertinent past surgical history.   Discharge Condition: Stable   Follow-up Information   Follow up with Pcp Not In System. Schedule an appointment as soon as possible for a visit in 1 week.      Follow up with [facility]. Schedule an appointment as soon as possible for a visit in 1 week.   Contact information:   [address] [place] [address] [phone]        Consults obtained - neurology   Discharge Medications      Medication List         aspirin EC 81 MG tablet  Take 81 mg by mouth daily.     BIOTIN PO  Take 1 tablet by mouth daily.     clopidogrel 75 MG tablet  Commonly known as:  PLAVIX  Take 75 mg by mouth daily with breakfast.     finasteride 5 MG tablet  Commonly known as:  PROSCAR  Take 5 mg by mouth daily.     levETIRAcetam 500 MG tablet  Commonly known as:  KEPPRA  Take 1 tablet (500 mg total) by mouth 2 (two)  times daily.     losartan 50 MG tablet  Commonly known as:  COZAAR  Take 25 mg by mouth daily.     multivitamin with minerals Tabs tablet  Take 1 tablet by mouth daily.     OMEGA 3 PO  Take 1 capsule by mouth daily.     pantoprazole 40 MG tablet  Commonly known as:  PROTONIX  Take 40 mg by mouth daily.     pravastatin 20 MG tablet  Commonly known as:  PRAVACHOL  Take 20 mg by mouth at bedtime.     VITAMIN D PO  Take 1 tablet by mouth daily.     VITAMIN E PO  Take 1 capsule by mouth daily.         Diet and Activity recommendation: See Discharge Instructions below   Discharge Instructions  Follow with Primary MD Dr Wynonia Hazard in 7 days   Do not drive and provide baby sitting services until you have seen the Neurologist and advised to do so again.  Get CBC, CMP, checked 7 days by Primary MD and again as instructed by your Primary MD. Get a 2 view Chest X ray done next visit if you had Pneumonia of Lung problems at the Hospital.  Get Medicines reviewed and adjusted.  Please request your Prim.MD to go over all Hospital Tests and Procedure/Radiological results at the follow up, please get all Hospital records sent to your Prim MD by signing hospital release before you go home.  Activity: As tolerated with Full fall precautions use walker/cane & assistance as needed   Diet: Heart Healthy  For Heart failure patients - Check your Weight same time everyday, if you gain over 2 pounds, or you develop in leg swelling, experience more shortness of breath or chest pain, call your Primary MD immediately. Follow Cardiac Low Salt Diet and 1.8 lit/day fluid restriction.  Disposition Home    If you experience worsening of your admission symptoms, develop shortness of breath, life threatening emergency, suicidal or homicidal thoughts you must seek medical attention immediately by calling 911 or calling your MD immediately  if symptoms less severe.  You Must read complete  instructions/literature along with all the possible adverse reactions/side effects for all the Medicines you take and that have been prescribed to you. Take any new Medicines after you have completely understood and accpet all the possible adverse reactions/side effects.   Do not drive when taking Pain medications.    Do not take more than prescribed Pain, Sleep and Anxiety Medications  Special Instructions: If you have smoked or chewed Tobacco  in the last 2 yrs please stop smoking, stop any regular Alcohol  and or any Recreational drug use.  Wear Seat belts while driving.   Please note  You were cared for by a hospitalist during your hospital stay. If you have any questions about your discharge medications or the care you received while you were in the hospital after you are discharged, you can call the unit and asked to speak with the hospitalist on call if the hospitalist that took care of you is not available. Once you are discharged, your primary care physician will handle any further medical issues. Please note that NO REFILLS for any discharge medications will be authorized once you are discharged, as it is imperative that you return to your primary care physician (or establish a relationship with a primary care physician if you do not have one) for your aftercare needs so that they can reassess your need for medications and monitor your lab values.   Major procedures and Radiology Reports - PLEASE review detailed and final reports for all details, in brief -   EEG  Impression: this is a normal awake and drowsy EEG. Please, be aware that a normal EEG does not exclude the possibility of epilepsy    Echo - Left ventricle: The cavity size was normal. Wall thickness was normal. Systolic function was normal. The estimated ejection fraction was in the range of 60% to 65%. Wall motion was normal; there were no regional wall motion abnormalities. There was an increased  relative contribution of atrial contraction to ventricular filling. - Left atrium: The atrium was mildly to moderately dilated.     Ct Head (brain) Wo Contrast  07/12/2013   CLINICAL DATA:  Left-sided facial droop, slurred speech  EXAM: CT HEAD WITHOUT CONTRAST  TECHNIQUE: Contiguous axial images were obtained from the base of the skull through the vertex without intravenous contrast.  COMPARISON:  05/01/2013  FINDINGS: A region of encephalomalacic change projects within the right frontoparietal region consistent an area of chronic MCA distribution infarction. There is diffuse cortical atrophy and areas of low attenuation within the subcortical, deep, and periventricular white matter regions. There is no evidence of acute hemorrhage nor mass effect. Hydrocephalus ex vacuo is appreciated. Punctate area of low attenuation projects along the peripheral base of the left lobe of the cerebellum. An area of low attenuation projects within the base of the right lentiform nucleus region. There is no evidence of a depressed skull fracture. Visualized paranasal sinuses and mastoid air cells are patent.  IMPRESSION: 1. Encephalomalacia change in the right frontoparietal region indicative of an area of prior MCA distribution infarction 2. Chronic and involutional changes with areas of lacunar infarction, chronic base of the right basal ganglia and left cerebellar hemisphere. 3. No evidence of acute abnormalities 4. Dr. Cyril Mourning was informed of these findings via telephone conversation at the time of the interpretation.   Electronically Signed   By: Salome Holmes M.D.   On: 07/12/2013 10:33   Mr Brain Wo Contrast  07/13/2013   CLINICAL DATA:  History of prior stroke and seizures. Recent possible seizure. Left-sided facial droop. Slurred speech. High blood pressure hypertension.  EXAM: MRI HEAD WITHOUT CONTRAST  TECHNIQUE: Multiplanar, multiecho pulse sequences of the brain and surrounding structures were obtained  without intravenous contrast.  COMPARISON:  08/01/2013 CT.  No comparison brain MR.  FINDINGS: No acute infarct.  Remote right posterior frontal-parietal lobe moderate to large infarct with encephalomalacia and subsequent dilation of the right lateral ventricle. This involved a portion of the right thalamus.  Remote cerebellar infarcts bilaterally.  Remote small left frontal and parietal lobe infarct.  Small vessel disease type changes.  Atrophy. Ventricular prominence most consistent with result of atrophy and prior infarcts rather than hydrocephalus.  No intracranial hemorrhage.  No intracranial mass lesion noted on this unenhanced exam.  Mild transverse ligament hypertrophy. Cervical medullary junction, pituitary region, pineal region and orbital structures unremarkable.  Atherosclerotic type changes of the vertebral arteries and basilar artery suspected. Motion somewhat limits evaluation. Internal carotid arteries are patent bilaterally.  Minimal paranasal sinus mucosal thickening.  IMPRESSION: No acute infarct.  Remote infarcts, small vessel disease type changes and atrophy as noted above.  Question narrowing of vertebral arteries and basilar artery.   Electronically Signed   By: Bridgett Larsson M.D.   On: 07/13/2013 14:29   Dg Chest Port 1 View  07/12/2013   CLINICAL DATA:  Cough; unresponsive  EXAM: PORTABLE CHEST - 1 VIEW  COMPARISON:  May 01, 2013  FINDINGS: There is no edema or consolidation. Heart size and pulmonary vascularity are normal. No adenopathy. There is atherosclerotic change in the aorta. There is arthropathy in both shoulders.  IMPRESSION: No edema or consolidation.   Electronically Signed   By: Bretta Bang M.D.   On: 07/12/2013 14:01    Micro Results      Recent Results (from the past 240 hour(s))  MRSA PCR SCREENING     Status: None   Collection Time    07/12/13 12:50 PM      Result Value Range Status   MRSA by PCR NEGATIVE  NEGATIVE Final   Comment:            The  GeneXpert  MRSA Assay (FDA     approved for NASAL specimens     only), is one component of a     comprehensive MRSA colonization     surveillance program. It is not     intended to diagnose MRSA     infection nor to guide or     monitor treatment for     MRSA infections.     History of present illness and  Hospital Course:     Kindly see H&P for history of present illness and admission details, please review complete Labs, Consult reports and Test reports for all details in brief [name], is a 79 y.o. male, history of right MCA territory ischemic CVA in the past causing dense left-sided hemiparesis uses a walker and assistance to walk at home, seizure with last seizure being a few months ago not on any anti-seizure medications, hypertension, dyslipidemia, who lives at home with his wife and wasn't his usual state of health until this morning when he initially had some facial twitching followed by generalized tonic-clonic activity suggestive of seizures.    Stroke was ruled out with nonacute CT scan and MRI brain, EEG was stable, echogram was nonacute, he was seen by neurologist Dr. Cyril Mourning followed by the neurologist on call over the weekend, after initial treatment with IV Keppra and Ativan patient gradually recovered and came back to his baseline and remained seizure free, he is now back to his baseline with chronic left-sided deficit due to previous CVA but no other new complaints. He will be placed on Keppra and discharged home on his home medications unchanged. He is been advised to follow with his primary care physician and neurology. Has been advised not to drive or provide babysitting services till cleared by neurologist. Plan was explained to the wife in detail.      Today   Subjective:   [name] today has no headache,no chest abdominal pain,no new weakness tingling or numbness, feels much better wants to go home today.   Objective:   Blood pressure 147/54,  pulse 61, temperature 97.8 F (36.6 C), temperature source Oral, resp. rate 18, height 5\' 10"  (1.778 m), weight 76.6 kg (168 lb 14 oz), SpO2 99.00%.   Intake/Output Summary (Last 24 hours) at 07/13/13 1707 Last data filed at 07/13/13 0900  Gross per 24 hour  Intake   1533 ml  Output   2100 ml  Net   -567 ml    Exam Awake Alert, Oriented *3, No new F.N deficits, Normal affect, Chr L sided weakness .AT,PERRAL Supple Neck,No JVD, No cervical lymphadenopathy appriciated.  Symmetrical Chest wall movement, Good air movement bilaterally, CTAB RRR,No Gallops,Rubs or new Murmurs, No Parasternal Heave +ve B.Sounds, Abd Soft, Non tender, No organomegaly appriciated, No rebound -guarding or rigidity. No Cyanosis, Clubbing or edema, No new Rash or bruise  Data Review   CBC w Diff: Lab Results  Component Value Date   WBC 7.3 07/13/2013   HGB 12.9* 07/13/2013   HCT 37.0* 07/13/2013   PLT 215 07/13/2013   LYMPHOPCT 25 07/12/2013   MONOPCT 8 07/12/2013   EOSPCT 2 07/12/2013   BASOPCT 1 07/12/2013    CMP: Lab Results  Component Value Date   NA 137 07/13/2013   K 3.6 07/13/2013   CL 103 07/13/2013   CO2 26 07/13/2013   BUN 13 07/13/2013   CREATININE 0.79 07/13/2013   PROT 7.4 07/12/2013   ALBUMIN 4.0 07/12/2013   BILITOT 0.5 07/12/2013   ALKPHOS  84 07/12/2013   AST 22 07/12/2013   ALT 15 07/12/2013  .  Lab Results  Component Value Date   HGBA1C 5.8* 07/13/2013    Lab Results  Component Value Date   CHOL 155 07/13/2013   HDL 51 07/13/2013   LDLCALC 86 07/13/2013   TRIG 91 07/13/2013   CHOLHDL 3.0 07/13/2013     Total Time in preparing paper work, data evaluation and todays exam - 35 minutes  Leroy Sea M.D on 07/13/2013 at 5:07 PM  [facility] Office  [phone]

## 2013-07-13 NOTE — Progress Notes (Signed)
   CARE MANAGEMENT NOTE 07/13/2013  Patient:  [name], [name]   Account Number:  [id number]  Date Initiated:  07/12/2013  Documentation initiated by:  MAYO,HENRIETTA  Subjective/Objective Assessment:   adm with dx of seizures; lives with spouse     Action/Plan:   Anticipated DC Date:     Anticipated DC Plan:        DC Planning Services  CM consult      Choice offered to / List presented to:             Status of service:  Completed, signed off Medicare Important Message given?   (If response is "NO", the following Medicare IM given date fields will be blank) Date Medicare IM given:   Date Additional Medicare IM given:    Discharge Disposition:  HOME W HOME HEALTH SERVICES  Per UR Regulation:  Reviewed for med. necessity/level of care/duration of stay  If discussed at Long Length of Stay Meetings, dates discussed:    Comments:  07/13/13 17:50 Cm spoke with Kathie Rhodes concerning choice for her husband's HHPT.  [facility] was chosen for HHPT.  No DME recc. Address and contact number was verified.  Referral faxed to [facility].  No other CM needs were communicated.  Freddy Jaksch, BSN, CM [phone].  Contact:  Earnest Rosier, Spouse  [phone]

## 2013-07-13 NOTE — Progress Notes (Signed)
[facility]                                                                                Patient Demographics  [name], is a 79 y.o. male, DOB - [birth year], OZH:[id number]  Admit date - 07/12/2013   Admitting Physician Micheal Sea, MD  Outpatient Primary MD for the patient is No primary provider on file.  LOS - 1   Chief Complaint  Patient presents with  . Code Stroke        Assessment & Plan    1. Recurrent seizure activity (at least second episode in the last few months) - who has had a large right MCA territory ischemic CVA in the past and was not in any seizure medications- CT head negative, e.g. stable, MRI brain pending, he is seizure free on Keppra which will be continued. Neuro following. Seizure and aspiration precautions. Much improved.     2. Somnolence and hypotension after Ativan in the ER. Resolved after IV fluids avoid Ativan if possible. He is back to baseline.    3. Hypertension and dyslipidemia. Home medications resumed.     4. Questionable history of type 2 diabetes mellitus. Pending A1c, sliding scale insulin for now.    No results found for this basename: HGBA1C    CBG (last 3)   Recent Labs  07/12/13 1037 07/12/13 1823 07/12/13 2208  GLUCAP 125* 86 118*       Code Status: Full  Family Communication: wife  Disposition Plan: Home   Procedures CT Head, MRI Brain, EEG   Consults  Neuro   Medications  Scheduled Meds: . aspirin EC  81 mg Oral Daily  . [START ON 07/14/2013] clopidogrel  75 mg Oral Q breakfast  . finasteride  5 mg Oral Daily  . heparin  5,000 Units Subcutaneous Q8H  . insulin aspart  0-9 Units Subcutaneous TID WC  . levETIRAcetam  500 mg Oral BID  . pantoprazole  40 mg Oral Daily  . simvastatin  40 mg Oral q1800  . sodium chloride  3 mL Intravenous Q12H   Continuous Infusions:  PRN Meds:.albuterol, guaiFENesin-dextromethorphan, ondansetron (ZOFRAN) IV, ondansetron, polyethylene  glycol  DVT Prophylaxis    Heparin   Lab Results  Component Value Date   PLT 215 07/13/2013    Antibiotics     Anti-infectives   None          Subjective:   [name] today has, No headache, No chest pain, No abdominal pain - No Nausea, No new weakness tingling or numbness, No Cough - SOB.   Objective:   Filed Vitals:   07/13/13 0430 07/13/13 0700 07/13/13 0800 07/13/13 0859  BP: 106/76 146/86 133/66 140/60  Pulse: 63 62    Temp: 98.2 F (36.8 C)  98 F (36.7 C)   TempSrc: Axillary  Axillary   Resp: 14 15 12    Height:      Weight: 76.6 kg (168 lb 14 oz)     SpO2:  93% 100% 96%    Wt Readings from Last 3 Encounters:  07/13/13 76.6 kg (168 lb 14 oz)     Intake/Output Summary (Last 24 hours) at 07/13/13  0940 Last data filed at 07/13/13 0900  Gross per 24 hour  Intake   1588 ml  Output   2300 ml  Net   -712 ml    Exam Awake Alert, Oriented X 3, No new F.N deficits, Normal affect, Chr L sided hemiperesis [place].AT,PERRAL Supple Neck,No JVD, No cervical lymphadenopathy appriciated.  Symmetrical Chest wall movement, Good air movement bilaterally, CTAB RRR,No Gallops,Rubs or new Murmurs, No Parasternal Heave +ve B.Sounds, Abd Soft, Non tender, No organomegaly appriciated, No rebound - guarding or rigidity. No Cyanosis, Clubbing or edema, No new Rash or bruise      Data Review   Micro Results Recent Results (from the past 240 hour(s))  MRSA PCR SCREENING     Status: None   Collection Time    07/12/13 12:50 PM      Result Value Range Status   MRSA by PCR NEGATIVE  NEGATIVE Final   Comment:            The GeneXpert MRSA Assay (FDA     approved for NASAL specimens     only), is one component of a     comprehensive MRSA colonization     surveillance program. It is not     intended to diagnose MRSA     infection nor to guide or     monitor treatment for     MRSA infections.    Radiology Reports Ct Head (brain) Wo Contrast  07/12/2013    CLINICAL DATA:  Left-sided facial droop, slurred speech  EXAM: CT HEAD WITHOUT CONTRAST  TECHNIQUE: Contiguous axial images were obtained from the base of the skull through the vertex without intravenous contrast.  COMPARISON:  05/01/2013  FINDINGS: A region of encephalomalacic change projects within the right frontoparietal region consistent an area of chronic MCA distribution infarction. There is diffuse cortical atrophy and areas of low attenuation within the subcortical, deep, and periventricular white matter regions. There is no evidence of acute hemorrhage nor mass effect. Hydrocephalus ex vacuo is appreciated. Punctate area of low attenuation projects along the peripheral base of the left lobe of the cerebellum. An area of low attenuation projects within the base of the right lentiform nucleus region. There is no evidence of a depressed skull fracture. Visualized paranasal sinuses and mastoid air cells are patent.  IMPRESSION: 1. Encephalomalacia change in the right frontoparietal region indicative of an area of prior MCA distribution infarction 2. Chronic and involutional changes with areas of lacunar infarction, chronic base of the right basal ganglia and left cerebellar hemisphere. 3. No evidence of acute abnormalities 4. Dr. Cyril Mourning was informed of these findings via telephone conversation at the time of the interpretation.   Electronically Signed   By: Salome Holmes M.D.   On: 07/12/2013 10:33   Dg Chest Port 1 View  07/12/2013   CLINICAL DATA:  Cough; unresponsive  EXAM: PORTABLE CHEST - 1 VIEW  COMPARISON:  May 01, 2013  FINDINGS: There is no edema or consolidation. Heart size and pulmonary vascularity are normal. No adenopathy. There is atherosclerotic change in the aorta. There is arthropathy in both shoulders.  IMPRESSION: No edema or consolidation.   Electronically Signed   By: Bretta Bang M.D.   On: 07/12/2013 14:01    CBC  Recent Labs Lab 07/12/13 1010 07/12/13 1018  07/13/13 0515  WBC 5.9  --  7.3  HGB 14.8 15.0 12.9*  HCT 42.1 44.0 37.0*  PLT 251  --  215  MCV 93.3  --  93.2  MCH 32.8  --  32.5  MCHC 35.2  --  34.9  RDW 12.3  --  12.3  LYMPHSABS 1.5  --   --   MONOABS 0.5  --   --   EOSABS 0.1  --   --   BASOSABS 0.0  --   --     Chemistries   Recent Labs Lab 07/12/13 1010 07/12/13 1018 07/13/13 0515  NA 134* 136 137  K 4.7 4.7 3.6  CL 98 100 103  CO2 26  --  26  GLUCOSE 101* 106* 92  BUN 18 19 13   CREATININE 0.90 1.00 0.79  CALCIUM 9.7  --  8.8  AST 22  --   --   ALT 15  --   --   ALKPHOS 84  --   --   BILITOT 0.5  --   --    ------------------------------------------------------------------------------------------------------------------ estimated creatinine clearance is 73.5 ml/min (by C-G formula based on Cr of 0.79). ------------------------------------------------------------------------------------------------------------------ No results found for this basename: HGBA1C,  in the last 72 hours ------------------------------------------------------------------------------------------------------------------  Recent Labs  07/13/13 0515  CHOL 155  HDL 51  LDLCALC 86  TRIG 91  CHOLHDL 3.0   ------------------------------------------------------------------------------------------------------------------ No results found for this basename: TSH, T4TOTAL, FREET3, T3FREE, THYROIDAB,  in the last 72 hours ------------------------------------------------------------------------------------------------------------------ No results found for this basename: VITAMINB12, FOLATE, FERRITIN, TIBC, IRON, RETICCTPCT,  in the last 72 hours  Coagulation profile  Recent Labs Lab 07/12/13 1010  INR 0.98    No results found for this basename: DDIMER,  in the last 72 hours  Cardiac Enzymes  Recent Labs Lab 07/12/13 1010  TROPONINI <0.30    ------------------------------------------------------------------------------------------------------------------ No components found with this basename: POCBNP,      Time Spent in minutes   35   SINGH,PRASHANT K M.D on 07/13/2013 at 9:40 AM  Between 7am to 7pm - Pager - [phone]  After 7pm go to [email or web address] - password [email or web address]  And look for the night coverage person covering for me after hours  [facility] Office  [phone]

## 2013-07-13 NOTE — Procedures (Signed)
EEG report.  Brief clinical history: 79 y/o brought in with a left focal motor seizure with subsequent generalization.   Technique: this is a 17 channel routine scalp EEG performed at the bedside with bipolar and monopolar montages arranged in accordance to the international 10/20 system of electrode placement. One channel was dedicated to EKG recording.  The study was performed during wakefulness and drowsiness. No activating procedures performed.  Description:In the wakeful state, the best background consisted of a medium amplitude, posterior dominant, well sustained, symmetric and reactive 10 Hz rhythm. Drowsiness demonstrated dropout of the alpha rhythm. No focal or generalized epileptiform discharges noted.  No slowing seen.  EKG showed sinus rhythm.  Impression: this is a normal awake and drowsy EEG. Please, be aware that a normal EEG does not exclude the possibility of epilepsy.  Clinical correlation is advised.  Wyatt Portela, MD

## 2013-07-13 NOTE — Evaluation (Signed)
Speech Language Pathology Evaluation Patient Details Name: [name] MRN: [id number] DOB: 1928-08-14 Today's Date: 07/13/2013 Time: 1125-1140 SLP Time Calculation (min): 15 min  Problem List:  Patient Active Problem List   Diagnosis Date Noted  . Seizure 07/12/2013  . Stroke   . TIA (transient ischemic attack)   . Seizures   . Hypertension   . High cholesterol    Past Medical History:  Past Medical History  Diagnosis Date  . Stroke     04/20/1998  . TIA (transient ischemic attack)   . Seizures     05/2013  . Hypertension   . High cholesterol    Past Surgical History: History reviewed. No pertinent past surgical history. HPI:  Patient is an 79 yo male admitted with seizure activity and to r/o new CVA. No acute CVA. Patient with h/o Rt CVA with Lt hemiparesis, HTN.    Assessment / Plan / Recommendation Clinical Impression  Evaluation complete. Patient present with residual dysarthria as only deficit which he reports is new in nature. Did not report dysarthria from previous CVA. Educated patient on speech intelligibilty strategies which he verbalized understanding of. Note MRI pending. If MRI negative for acute infact, suspect mild residual dysarthria s/p recent seizure. If positive for acute CVA, SLP will f/u for treatment and to establish d/c plan for f/u. Will f/u via chart review 12/8.     SLP Assessment   (TBD pending MRI results)    Follow Up Recommendations   (tbd)    Frequency and Duration        Pertinent Vitals/Pain none   SLP Goals  SLP Goals Progress/Goals/Alternative treatment plan discussed with pt/caregiver and they: Agree  SLP Evaluation Prior Functioning  Cognitive/Linguistic Baseline: Within functional limits Type of Home: House  Lives With: Spouse Available Help at Discharge: Family;Available 24 hours/day   Cognition  Overall Cognitive Status: Within Functional Limits for tasks assessed Orientation Level: Oriented X4     Comprehension  Auditory Comprehension Overall Auditory Comprehension: Appears within functional limits for tasks assessed Visual Recognition/Discrimination Discrimination: Within Function Limits Reading Comprehension Reading Status: Within funtional limits    Expression Expression Primary Mode of Expression: Verbal Verbal Expression Overall Verbal Expression: Appears within functional limits for tasks assessed Written Expression Dominant Hand: Right   Oral / Motor Oral Motor/Sensory Function Overall Oral Motor/Sensory Function: Impaired Labial ROM: Within Functional Limits Labial Symmetry: Within Functional Limits Labial Strength: Within Functional Limits Labial Sensation: Within Functional Limits Lingual ROM: Reduced left Lingual Symmetry: Within Functional Limits Lingual Strength: Reduced Lingual Sensation: Within Functional Limits Facial ROM: Within Functional Limits Facial Symmetry: Within Functional Limits Facial Strength: Within Functional Limits Facial Sensation: Within Functional Limits Velum: Within Functional Limits Mandible: Within Functional Limits Motor Speech Overall Motor Speech: Impaired Respiration: Within functional limits Phonation: Normal Resonance: Within functional limits Articulation: Impaired Level of Impairment: Conversation Intelligibility: Intelligibility reduced Word: 75-100% accurate Phrase: 75-100% accurate Sentence: 75-100% accurate Conversation: 75-100% accurate Motor Planning: Witnin functional limits Interfering Components: Premorbid status (?) Effective Techniques: Increased vocal intensity;Slow rate;Over-articulate   GO    Ferdinand Lango MA, CCC-SLP [phone]  Ferdinand Lango Meryl 07/13/2013, 11:43 AM

## 2013-07-13 NOTE — Progress Notes (Signed)
Pt d/c home accompanied by daughter and spouse  D/c instruction and follow up teaching given to pt and spouse, to call [facility]. Both pt and wife verbalized  Good understanding

## 2013-07-13 NOTE — Evaluation (Signed)
Physical Therapy Evaluation Patient Details Name: [name] MRN: [id number] DOB: Apr 26, 1929 Today's Date: 07/13/2013 Time: 7829-5621 PT Time Calculation (min): 18 min  PT Assessment / Plan / Recommendation History of Present Illness  Patient is an 79 yo male admitted with seizure activity and to r/o new CVA.  Patient with h/o Rt CVA with Lt hemiparesis, HTN.  Clinical Impression  Patient presents with problems listed below.  Will benefit from acute PT to maximize independence prior to discharge home with wife.    PT Assessment  Patient needs continued PT services    Follow Up Recommendations  Home health PT;Supervision/Assistance - 24 hour    Does the patient have the potential to tolerate intense rehabilitation      Barriers to Discharge        Equipment Recommendations  None recommended by PT    Recommendations for Other Services     Frequency Min 3X/week    Precautions / Restrictions Precautions Precautions: Fall Restrictions Weight Bearing Restrictions: No   Pertinent Vitals/Pain       Mobility  Bed Mobility Bed Mobility: Not assessed (Patient sitting at EOB as PT entered room) Transfers Transfers: Sit to Stand;Stand to Sit Sit to Stand: 1: +2 Total assist;With upper extremity assist;From bed Sit to Stand: Patient Percentage: 60% Stand to Sit: 3: Mod assist;With upper extremity assist;With armrests;To chair/3-in-1 Details for Transfer Assistance: Verbal cues for placement of feet and RUE.  Assist to rise to standing and for balance.  Instructed patient to stand for several seconds prior to ambulation.  Assist to return to sitting in chair.  Cues to back up to chair before sitting - patient began sitting before in proper place at chair. Ambulation/Gait Ambulation/Gait Assistance: 1: +2 Total assist Ambulation/Gait: Patient Percentage: 70% Ambulation Distance (Feet): 52 Feet Assistive device: 2 person hand held assist Ambulation/Gait Assistance Details:  Verbal cues to stand upright and look forward during gait.  Lt knee buckling consistently during gait.  Cues to move at slower, safer pace.  Decreased control of LLE in both stance and swing phases of gait. Gait Pattern: Step-to pattern;Decreased stance time - left;Decreased step length - right;Decreased weight shift to left;Trunk flexed Gait velocity: At times patient increased velocity to unsafe speed.  Cues to slow to safe speed.    Exercises     PT Diagnosis: Difficulty walking;Hemiplegia non-dominant side;Abnormality of gait  PT Problem List: Decreased strength;Decreased activity tolerance;Decreased balance;Decreased mobility;Decreased knowledge of use of DME;Decreased knowledge of precautions;Impaired sensation;Impaired tone PT Treatment Interventions: DME instruction;Gait training;Functional mobility training;Balance training;Patient/family education     PT Goals(Current goals can be found in the care plan section) Acute Rehab PT Goals Patient Stated Goal: To go home PT Goal Formulation: With patient Time For Goal Achievement: 07/20/13 Potential to Achieve Goals: Good  Visit Information  Last PT Received On: 07/13/13 Assistance Needed: +2 History of Present Illness: Patient is an 79 yo male admitted with seizure activity and to r/o new CVA.  Patient with h/o Rt CVA with Lt hemiparesis, HTN.       Prior Functioning  Home Living Family/patient expects to be discharged to:: Private residence Living Arrangements: Spouse/significant other Available Help at Discharge: Family;Available 24 hours/day Type of Home: House Home Access: Level entry Home Layout: One level Home Equipment: Cane - single point;Bedside commode;Shower seat;Wheelchair - manual Prior Function Level of Independence: Independent with assistive device(s);Needs assistance Gait / Transfers Assistance Needed: Uses cane for ambulation ADL's / Homemaking Assistance Needed: Assist needed for bathing  recently.  Comments: Patient reports he still drives. Communication Communication: Expressive difficulties (Slightly slurred speech) Dominant Hand: Right    Cognition  Cognition Arousal/Alertness: Awake/alert Behavior During Therapy: WFL for tasks assessed/performed Overall Cognitive Status: Within Functional Limits for tasks assessed    Extremity/Trunk Assessment Upper Extremity Assessment Upper Extremity Assessment: LUE deficits/detail LUE Deficits / Details: Strength 2+/5 with increased flexor tone. LUE Sensation: decreased light touch LUE Coordination: decreased gross motor;decreased fine motor Lower Extremity Assessment Lower Extremity Assessment: LLE deficits/detail LLE Deficits / Details: Strength grossly 3+/5 to 4-/5.  Increased tone noted. LLE Sensation: decreased light touch LLE Coordination: decreased gross motor   Balance Balance Balance Assessed: Yes Static Sitting Balance Static Sitting - Balance Support: No upper extremity supported;Feet supported Static Sitting - Level of Assistance: 5: Stand by assistance Static Sitting - Comment/# of Minutes: 6 Static Standing Balance Static Standing - Balance Support: Right upper extremity supported Static Standing - Level of Assistance: 4: Min assist Static Standing - Comment/# of Minutes: 2 minutes with flexed posture and leaning to left  End of Session PT - End of Session Equipment Utilized During Treatment: Gait belt Activity Tolerance: Patient limited by fatigue Patient left: in chair;with call bell/phone within reach Nurse Communication: Mobility status  GP     Vena Austria 07/13/2013, 9:37 AM Durenda Hurt. Renaldo Fiddler, [facility] Acute Rehab Services Pager [phone]

## 2013-07-13 NOTE — Progress Notes (Signed)
Subjective: There no family members present this morning. The patient just finished breakfast. He denies any further seizure activity. He feels well this morning and is without complaints. He has not yet had his MRI. He believes his speech is still somewhat slurred; although, this appears to be very mild.  Objective: Current vital signs: BP 133/66  Pulse 62  Temp(Src) 98.2 F (36.8 C) (Axillary)  Resp 12  Ht 5\' 10"  (1.778 m)  Wt 76.6 kg (168 lb 14 oz)  BMI 24.23 kg/m2  SpO2 100% Vital signs in last 24 hours: Temp:  [97.4 F (36.3 C)-98.4 F (36.9 C)] 98.2 F (36.8 C) (12/06 0430) Pulse Rate:  [61-104] 62 (12/06 0700) Resp:  [12-23] 12 (12/06 0800) BP: (73-179)/(41-99) 133/66 mmHg (12/06 0800) SpO2:  [93 %-100 %] 100 % (12/06 0800) Weight:  [75.7 kg (166 lb 14.2 oz)-76.6 kg (168 lb 14 oz)] 76.6 kg (168 lb 14 oz) (12/06 0430)  Intake/Output from previous day: 12/05 0701 - 12/06 0700 In: 1438 [I.V.:1333; IV Piggyback:105] Out: 2050 [Urine:2050] Intake/Output this shift: Total I/O In: 150 [I.V.:150] Out: -  Nutritional status: Cardiac  Physical Exam  General - pleasant 79 year old male in no acute distress. Heart - Regular rate and rhythm - no murmer Lungs - Clear to auscultation  Neurologic Exam:  MENTAL STATUS: awake, alert, Language fluent Follows simple commands.  CRANIAL NERVES: pupils equal and reactive to light, visual fields full to confrontation, extraocular muscles intact, no nystagmus, facial sensation and strength symmetric, uvula midline, shoulder shrug symmetric, tongue midline MOTOR: RUE-5/5  LUE-2-3/5  RLE- 5/5   RUE - somewhat limited range of motion secondary to long-standing right shoulder problems. SENSORY: Decreased sensation to light touch left upper and lower extremities. COORDINATION: finger-nose-finger normal with right upper extremity GAIT/STATION: Deferred at this time.   Lab Results: Basic Metabolic Panel:  Recent Labs Lab 07/12/13 1010  07/12/13 1018 07/13/13 0515  NA 134* 136 137  K 4.7 4.7 3.6  CL 98 100 103  CO2 26  --  26  GLUCOSE 101* 106* 92  BUN 18 19 13   CREATININE 0.90 1.00 0.79  CALCIUM 9.7  --  8.8    Liver Function Tests:  Recent Labs Lab 07/12/13 1010  AST 22  ALT 15  ALKPHOS 84  BILITOT 0.5  PROT 7.4  ALBUMIN 4.0   No results found for this basename: LIPASE, AMYLASE,  in the last 168 hours No results found for this basename: AMMONIA,  in the last 168 hours  CBC:  Recent Labs Lab 07/12/13 1010 07/12/13 1018 07/13/13 0515  WBC 5.9  --  7.3  NEUTROABS 3.8  --   --   HGB 14.8 15.0 12.9*  HCT 42.1 44.0 37.0*  MCV 93.3  --  93.2  PLT 251  --  215    Cardiac Enzymes:  Recent Labs Lab 07/12/13 1010  TROPONINI <0.30    Lipid Panel:  Recent Labs Lab 07/13/13 0515  CHOL 155  TRIG 91  HDL 51  CHOLHDL 3.0  VLDL 18  LDLCALC 86    CBG:  Recent Labs Lab 07/12/13 1037 07/12/13 1823 07/12/13 2208  GLUCAP 125* 86 118*    Microbiology: Results for orders placed during the hospital encounter of 07/12/13  MRSA PCR SCREENING     Status: None   Collection Time    07/12/13 12:50 PM      Result Value Range Status   MRSA by PCR NEGATIVE  NEGATIVE Final   Comment:  The GeneXpert MRSA Assay (FDA     approved for NASAL specimens     only), is one component of a     comprehensive MRSA colonization     surveillance program. It is not     intended to diagnose MRSA     infection nor to guide or     monitor treatment for     MRSA infections.    Coagulation Studies:  Recent Labs  07/12/13 1010  LABPROT 12.8  INR 0.98    Imaging:  Ct Head (brain) Wo Contrast 07/12/2013    1. Encephalomalacia change in the right frontoparietal region indicative of an area of prior MCA distribution infarction 2. Chronic and involutional changes with areas of lacunar infarction, chronic base of the right basal ganglia and left cerebellar hemisphere. 3. No evidence of acute  abnormalities    Dg Chest [facility] 07/12/2013    No edema or consolidation.    EEG 07/13/2013 This is a normal awake and drowsy EEG. Please, be aware that a normal EEG does not exclude the possibility of epilepsy.  Clinical correlation is advised.   2-D echo - pending   Medications:  Scheduled: . aspirin EC  81 mg Oral Daily  . heparin  5,000 Units Subcutaneous Q8H  . insulin aspart  0-9 Units Subcutaneous TID WC  . levETIRAcetam  500 mg Intravenous BID  . sodium chloride  3 mL Intravenous Q12H    Assessment/Plan: The patient reports no further seizure activity on Keppra. The patient's nurse confirms no further seizures. MRI of the head to be performed today. EEG normal as noted above. Physical therapist to evaluate patient today.  Delton See PA-C [facility] Pager [phone] 07/13/2013, 9:03 AM

## 2013-07-13 NOTE — Progress Notes (Signed)
Pt a transfer from 2W awake alert and oriented x 4 no seizure activity denied any clo at present  Stroke and seizure education given py oob in chair at bedside . Initial assessment done. Will continue to monitor

## 2013-07-15 LAB — GLUCOSE, CAPILLARY: Glucose-Capillary: 109 mg/dL — ABNORMAL HIGH (ref 70–99)

## 2013-07-18 ENCOUNTER — Ambulatory Visit (INDEPENDENT_AMBULATORY_CARE_PROVIDER_SITE_OTHER): Payer: Medicare Other | Admitting: Neurology

## 2013-07-18 ENCOUNTER — Encounter: Payer: Self-pay | Admitting: Neurology

## 2013-07-18 VITALS — BP 150/85 | HR 68 | Ht 69.0 in | Wt 163.0 lb

## 2013-07-18 DIAGNOSIS — R569 Unspecified convulsions: Secondary | ICD-10-CM

## 2013-07-18 MED ORDER — LEVETIRACETAM 500 MG PO TABS: 500.0000 mg | ORAL_TABLET | Freq: Two times a day (BID) | ORAL | Status: DC

## 2013-07-18 NOTE — Patient Instructions (Signed)
Seizure, Adult A seizure is abnormal electrical activity in the brain. Seizures can cause a change in attention or behavior (altered mental status). Seizures often involve uncontrollable shaking (convulsions). Seizures usually last from 30 seconds to 2 minutes. Epilepsy is a brain disorder in which a patient has repeated seizures over time. CAUSES  There are many different problems that can cause seizures. In some cases, no cause is found. Common causes of seizures include:  Head injuries.  Brain tumors.  Infections.  Imbalance of chemicals in the blood.  Kidney failure or liver failure.  Heart disease.  Drug abuse.  Stroke.  Withdrawal from certain drugs or alcohol.  Birth defects.  Malfunction of a neurosurgical device placed in the brain. SYMPTOMS  Symptoms vary depending on the part of the brain that is involved. Right before a seizure, you may have a warning (aura) that a seizure is about to occur. An aura may include the following symptoms:   Fear or anxiety.  Nausea.  Feeling like the room is spinning (vertigo).  Vision changes, such as seeing flashing lights or spots. Common symptoms during a seizure include:  Convulsions.  Drooling.  Rapid eye movements.  Grunting.  Loss of bladder and bowel control.  Bitter taste in the mouth. After a seizure, you may feel confused and sleepy. You may also have an injury resulting from convulsions during the seizure. DIAGNOSIS  Your caregiver will perform a physical exam and run some tests to determine the type and cause of your seizure. These tests may include:  Blood tests.  A lumbar puncture test. In this test, a small amount of fluid is removed from the spine and examined.  Electrocardiography (ECG). This test records the electrical activity in your heart.  Imaging tests, such as computed tomography (CT) scans or magnetic resonance imaging (MRI).  Electroencephalography (EEG). This test records the electrical  activity in your brain. TREATMENT  Seizures usually stop on their own. Treatment will depend on the cause of your seizure. In some cases, medicine may be given to prevent future seizures. HOME CARE INSTRUCTIONS   If you are given medicines, take them exactly as prescribed by your caregiver.  Keep all follow-up appointments as directed by your caregiver.  Do not swim or drive until your caregiver says it is okay.  Teach friends and family what to do if you have a seizure. They should:  Lay you on the ground to prevent a fall.  Put a cushion under your head.  Loosen any tight clothing around your neck.  Turn you on your side. If vomiting occurs, this helps keep your airway clear.  Stay with you until you recover. SEEK IMMEDIATE MEDICAL CARE IF:  The seizure lasts longer than 2 to 5 minutes.  The seizure is severe or the person does not wake up after the seizure.  The person has altered mental status. Drive the person to the emergency department or call your local emergency services (911 in [place] MAKE SURE YOU:  Understand these instructions.  Will watch your condition.  Will get help right away if you are not doing well or get worse. Document Released: 07/22/2000 Document Revised: 10/17/2011 Document Reviewed: 07/13/2011 [employer] Patient Information 2014 [employer].  

## 2013-07-18 NOTE — Progress Notes (Signed)
Reason for visit: Seizure  [name] is a 79 y.o. male  History of present illness:  [name] is an 79 year old right-handed white male with a history of cerebrovascular disease. In 1999, the patient suffered a moderate size right parietal stroke event. The patient has been left with a left hemiparesis and a sensory deficit on the left. The patient has a chronic gait disorder. The patient has been generally well until 05/01/2013. The patient had an event of left arm jerking and slurred speech. The patient was not placed on seizure medications at that time. The patient had another event on 07/12/2013. Once again, the patient began jerking on the left hand, and then this spread to involve the upper arm, and was associated with some slurred speech. The patient had generalization of the seizure after he got to the emergency room. MRI evaluation did not show a new stroke event. This report and the images were reviewed on line. The patient has a chronic right parietal stroke, and chronic left cerebellar stroke. The patient had some changes and increasing weakness of the left side following the seizure. The patient was treated with Keppra. The patient has done well since coming out of the hospital. The patient is getting therapy with physical and occupational therapy. The patient is using a walker for ambulation. The patient denies headache or visual field changes. The patient was operating motor vehicle prior to the second seizure event. The patient has not driven since. The patient is sent to this office for an evaluation.   Past Medical History  Diagnosis Date  . Stroke     04/20/1998  . TIA (transient ischemic attack)   . Seizures     05/2013  . Hypertension   . High cholesterol   . Cancer   . Prostate cancer     Radium seed implants  . Dyslipidemia   . History of cerebrovascular disease     Right parietal, left cerebellar strokes, chronic    Past Surgical History  Procedure  Laterality Date  . Abdominal hernia repair    . Cataract extraction Bilateral   . Tonsillectomy    . Radium seed implants      Prostate cancer  . Carotid endarterectomy      Bilateral    Family History  Problem Relation Age of Onset  . Heart failure Brother   . Heart disease Brother   . Heart failure Mother   . Alcoholism Father   . Diabetes Daughter   . Diabetes Brother   . Heart disease Brother   . Heart disease Brother   . Diabetes Sister     Social history:  reports that he has never smoked. He has never used smokeless tobacco. He reports that he does not drink alcohol or use illicit drugs.  Medications:  Current Outpatient Prescriptions on File Prior to Visit  Medication Sig Dispense Refill  . aspirin EC 81 MG tablet Take 81 mg by mouth daily.      Marland Kitchen BIOTIN PO Take 1 tablet by mouth daily.      . Cholecalciferol (VITAMIN D PO) Take 1 tablet by mouth daily.      . clopidogrel (PLAVIX) 75 MG tablet Take 75 mg by mouth daily with breakfast.      . finasteride (PROSCAR) 5 MG tablet Take 5 mg by mouth daily.      Marland Kitchen losartan (COZAAR) 50 MG tablet Take 25 mg by mouth daily.      . Multiple  Vitamin (MULTIVITAMIN WITH MINERALS) TABS tablet Take 1 tablet by mouth daily.      . Omega-3 Fatty Acids (OMEGA 3 PO) Take 1 capsule by mouth daily.      . pantoprazole (PROTONIX) 40 MG tablet Take 40 mg by mouth daily.      . pravastatin (PRAVACHOL) 20 MG tablet Take 20 mg by mouth at bedtime.      Marland Kitchen VITAMIN E PO Take 1 capsule by mouth daily.       No current facility-administered medications on file prior to visit.      Allergies  Allergen Reactions  . Ativan [Lorazepam] Other (See Comments)    MOOD CHANGE    ROS:  Out of a complete 14 system review of symptoms, the patient complains only of the following symptoms, and all other reviewed systems are negative.  Fatigue Snoring Easy bruising, easy bleeding Feeling cold Skin sensitivity Weakness, slurred speech, seizure,  tremor Decreased energy  Blood pressure 150/85, pulse 68, height 5\' 9"  (1.753 m), weight 163 lb (73.936 kg).  Physical Exam  General: The patient is alert and cooperative at the time of the examination.  Head: Pupils are equal, round, and reactive to light. Discs are flat bilaterally.  Neck: The neck is supple, no carotid bruits are noted.  Respiratory: The respiratory examination is clear.  Cardiovascular: The cardiovascular examination reveals a regular rate and rhythm, no obvious murmurs or rubs are noted.  Neuromuscular: The patient has incomplete abduction and range of movement of the right shoulder, some restriction as well on the left shoulder to a lesser degree.  Skin: Extremities are without significant edema.  Neurologic Exam  Mental status: The patient is alert and oriented x 3 at the time of the examination.  Cranial nerves: Facial symmetry is not present. There is a compression of the left nasolabial fold. There is good sensation of the face to pinprick and soft touch bilaterally on the forehead, decreased pinprick sensation on the lower face on the left. The strength of the facial muscles and the muscles to head turning and shoulder shrug are normal bilaterally. Speech is well enunciated, no aphasia or dysarthria is noted. Extraocular movements are full. Visual fields are full.  Motor: The motor testing reveals 5 over 5 strength of the right extremities. On the left, the patient has 4/5 strength of the intrinsic muscles of the hand, and with the proximal muscles of the left arm. The patient has 4/5 strength proximally in the left leg. Increased motor tone is noted on the left arm and leg.  Sensory: Sensory testing is intact to pinprick, soft touch, vibration sensation, and position sense on the right extremities. There is decreased pinprick sensation on the left arm and leg, decreased vibration sensation and position sensation on the left side. Extinction is noted with the  left lower extremity to double simultaneous stimulation. This is not noted with the left arm.  Coordination: Cerebellar testing reveals good finger-nose-finger and heel-to-shin bilaterally, with the exception that there is mild dysmetria with finger-nose-finger with the left arm.  Gait and station: Gait is slightly wide-based, unsteady, circumduction gait with the left leg. The patient walks with a walker. Tandem gait was not attempted. Romberg is negative.  Reflexes: Deep tendon reflexes are asymmetric, increased on the left arm and leg. Toes are downgoing bilaterally.   Assessment/Plan:  1. Cerebrovascular disease, right parietal stroke, left cerebellar stroke, chronic  2. Seizure disorder, focal onset with secondary generalization  The patient is on Keppra  taking 500 mg twice daily. He is doing well so far on this medication. The patient has any further events, he will contact our office. The patient is not to operate a motor vehicle for least 6 months. The patient will followup through this office in 4 months. A prescription was given for 90 day supply and 3 refills of the Keppra to take to the [facility].  C. Lesia Sago MD 07/20/2013 9:57 AM  [facility] [address] [place], [place] [address]  Phone [phone] Fax [phone]

## 2013-07-28 ENCOUNTER — Other Ambulatory Visit: Payer: Self-pay

## 2013-07-28 DIAGNOSIS — R569 Unspecified convulsions: Secondary | ICD-10-CM

## 2013-07-28 MED ORDER — LEVETIRACETAM 500 MG PO TABS: 500.0000 mg | ORAL_TABLET | Freq: Two times a day (BID) | ORAL | Status: DC

## 2013-11-20 ENCOUNTER — Encounter (INDEPENDENT_AMBULATORY_CARE_PROVIDER_SITE_OTHER): Payer: Self-pay

## 2013-11-20 ENCOUNTER — Encounter: Payer: Self-pay | Admitting: Nurse Practitioner

## 2013-11-20 ENCOUNTER — Ambulatory Visit (INDEPENDENT_AMBULATORY_CARE_PROVIDER_SITE_OTHER): Payer: Medicare Other | Admitting: Nurse Practitioner

## 2013-11-20 VITALS — BP 150/81 | HR 57 | Ht 69.5 in | Wt 172.0 lb

## 2013-11-20 DIAGNOSIS — R569 Unspecified convulsions: Secondary | ICD-10-CM

## 2013-11-20 DIAGNOSIS — I6350 Cerebral infarction due to unspecified occlusion or stenosis of unspecified cerebral artery: Secondary | ICD-10-CM

## 2013-11-20 DIAGNOSIS — I639 Cerebral infarction, unspecified: Secondary | ICD-10-CM

## 2013-11-20 NOTE — Progress Notes (Signed)
I have read the note, and I agree with the clinical assessment and plan.  Charles K Willis   

## 2013-11-20 NOTE — Patient Instructions (Signed)
Patient to continue Keppra at current meds gets  meds from the [place] Followup in 6-8 months

## 2013-11-20 NOTE — Progress Notes (Signed)
[facility]  PATIENT: [name] DOB: [birth year]   REASON FOR VISIT: Followup for seizure disorder    HISTORY OF PRESENT ILLNESS: [name], 80 year old male returns for followup. He was last seen in this office by [name] 07/18/2013 after suffering a seizure. He has  a history of right parietal stroke with resulting left hemiparesis and sensory deficit on the left he also has a chronic gait disorder. He is ambulating with cane today, no fall since last seen, he is currently on Keppra 500 twice a day without further seizure events, he gets his medications through the [place]. His physical therapy has concluded. He has not gone back to driving because his family does not feel it is safe for him to drive. He returns for reevaluation   HISTORY: of cerebrovascular disease. In 1999, the patient suffered a moderate size right parietal stroke event. The patient has been left with a left hemiparesis and a sensory deficit on the left. The patient has a chronic gait disorder. The patient has been generally well until 05/01/2013. The patient had an event of left arm jerking and slurred speech. The patient was not placed on seizure medications at that time. The patient had another event on 07/12/2013. Once again, the patient began jerking on the left hand, and then this spread to involve the upper arm, and was associated with some slurred speech. The patient had generalization of the seizure after he got to the emergency room. MRI evaluation did not show a new stroke event. This report and the images were reviewed on line. The patient has a chronic right parietal stroke, and chronic left cerebellar stroke. The patient had some changes and increasing weakness of the left side following the seizure. The patient was treated with Keppra. The patient has done well since coming out of the hospital. The patient is getting therapy with physical and occupational therapy. The patient is using  a walker for ambulation. The patient denies headache or visual field changes. The patient was operating motor vehicle prior to the second seizure event. The patient has not driven since. The patient is sent to this office for an evaluation.    REVIEW OF SYSTEMS: Full 14 system review of systems performed and notable only for those listed, all others are neg:  Constitutional: Fatigue Cardiovascular: N/A  Ear/Nose/Throat: N/A  Skin: N/A  Eyes: N/A  Respiratory: N/A  Gastroitestinal: N/A  Hematology/Lymphatic: Easy bruising Endocrine: N/A Musculoskeletal: Walking difficulty Allergy/Immunology: N/A  Neurological: N/A Psychiatric: N/A   ALLERGIES: Allergies  Allergen Reactions  . Ativan [Lorazepam] Other (See Comments)    MOOD CHANGE    HOME MEDICATIONS: Outpatient Prescriptions Prior to Visit  Medication Sig Dispense Refill  . aspirin EC 81 MG tablet Take 81 mg by mouth daily.      Marland Kitchen BIOTIN PO Take 1 tablet by mouth daily.      . Cholecalciferol (VITAMIN D PO) Take 1 tablet by mouth daily.      . clopidogrel (PLAVIX) 75 MG tablet Take 75 mg by mouth daily with breakfast.      . finasteride (PROSCAR) 5 MG tablet Take 5 mg by mouth daily.      Marland Kitchen levETIRAcetam (KEPPRA) 500 MG tablet Take 1 tablet (500 mg total) by mouth 2 (two) times daily.  180 tablet  3  . losartan (COZAAR) 50 MG tablet Take 25 mg by mouth daily.      . Multiple Vitamin (MULTIVITAMIN WITH MINERALS) TABS tablet Take 1 tablet  by mouth daily.      . Omega-3 Fatty Acids (OMEGA 3 PO) Take 1 capsule by mouth daily.      . pantoprazole (PROTONIX) 40 MG tablet Take 40 mg by mouth daily.      . pravastatin (PRAVACHOL) 20 MG tablet Take 20 mg by mouth at bedtime.      Marland Kitchen VITAMIN E PO Take 1 capsule by mouth daily.       No facility-administered medications prior to visit.    PAST MEDICAL HISTORY: Past Medical History  Diagnosis Date  . Stroke     04/20/1998  . TIA (transient ischemic attack)   . Seizures      05/2013  . Hypertension   . High cholesterol   . Cancer   . Prostate cancer     Radium seed implants  . Dyslipidemia   . History of cerebrovascular disease     Right parietal, left cerebellar strokes, chronic    PAST SURGICAL HISTORY: Past Surgical History  Procedure Laterality Date  . Abdominal hernia repair    . Cataract extraction Bilateral   . Tonsillectomy    . Radium seed implants      Prostate cancer  . Carotid endarterectomy      Bilateral    FAMILY HISTORY: Family History  Problem Relation Age of Onset  . Heart failure Brother   . Heart disease Brother   . Heart failure Mother   . Alcoholism Father   . Diabetes Daughter   . Diabetes Brother   . Heart disease Brother   . Heart disease Brother   . Diabetes Sister     SOCIAL HISTORY: History   Social History  . Marital Status: Married    Spouse Name: N/A    Number of Children: 2    . Years of Education: college 2   Occupational History  . Not on file.   Social History Main Topics  . Smoking status: Never Smoker   . Smokeless tobacco: Never Used  . Alcohol Use: No  . Drug Use: No  . Sexual Activity: Not on file   Other Topics Concern  . Not on file   Social History Narrative   Married, 2 children 1 deceased   Right handed   2 yr college   occ tea     PHYSICAL EXAM  Filed Vitals:   11/20/13 1333  BP: 150/81  Pulse: 57  Height: 5' 9.5" (1.765 m)  Weight: 172 lb (78.019 kg)   Body mass index is 25.04 kg/(m^2).  Generalized: Well developed, in no acute distress  Head: normocephalic and atraumatic,. Oropharynx benign  Neck: Supple, no carotid bruits  Cardiac: Regular rate rhythm, no murmur  Musculoskeletal: Incomplete abduction and range of motion of the right shoulder some restriction on the left shoulder to a lesser degree.  Neurological examination   Mentation: Alert oriented to time, place, history taking. Follows all commands speech and language fluent  Cranial nerve II-XII:  Pupils were equal round reactive to light extraocular movements were full, visual field were full on confrontational test. Facial sensation and strength were normal. hearing was intact to finger rubbing bilaterally. Uvula tongue midline. head turning and shoulder shrug were normal and symmetric.Tongue protrusion into cheek strength was normal. Motor: normal bulk and tone, full strength in the BUE, BLE, in the right extremities. 4/5 strength of the intrinsic muscles of the hand in the proximal muscles of the left arm, 4/5 strength proximally in the left leg. Increased motor  tone is noted on left arm and leg  Sensory: normal and symmetric to light touch, pinprick, and  vibration on the right, decreased pinprick sensation, decreased vibratory sensation to the left arm and leg. Coordination: finger-nose-finger, heel-to-shin bilaterally, mild dysmetria with finger to nose finger of the left arm  Reflexes: Increased  on the left arm and leg, normal on the right ,plantar responses were flexor bilaterally. Gait and Station: Rising up from seated position with assistance, wide based and steady circumduction gait with the left leg, ambulates with single-point cane. Tandem gait not attempted   DIAGNOSTIC DATA (LABS, IMAGING, TESTING) - I reviewed patient records, labs, notes, testing and imaging myself where available.  Lab Results  Component Value Date   WBC 7.3 07/13/2013   HGB 12.9* 07/13/2013   HCT 37.0* 07/13/2013   MCV 93.2 07/13/2013   PLT 215 07/13/2013      Component Value Date/Time   NA 137 07/13/2013 0515   K 3.6 07/13/2013 0515   CL 103 07/13/2013 0515   CO2 26 07/13/2013 0515   GLUCOSE 92 07/13/2013 0515   BUN 13 07/13/2013 0515   CREATININE 0.79 07/13/2013 0515   CALCIUM 8.8 07/13/2013 0515   PROT 7.4 07/12/2013 1010   ALBUMIN 4.0 07/12/2013 1010   AST 22 07/12/2013 1010   ALT 15 07/12/2013 1010   ALKPHOS 84 07/12/2013 1010   BILITOT 0.5 07/12/2013 1010   GFRNONAA 81* 07/13/2013 0515   GFRAA >90  07/13/2013 0515   Lab Results  Component Value Date   CHOL 155 07/13/2013   HDL 51 07/13/2013   LDLCALC 86 07/13/2013   TRIG 91 07/13/2013   CHOLHDL 3.0 07/13/2013   Lab Results  Component Value Date   HGBA1C 5.8* 07/13/2013    ASSESSMENT AND PLAN  80 y.o. year old male  has a past medical history of Stroke; right parietal stroke, left cerebellar stroke and seizure disorder focal onset with secondary generalization. Currently on Keppra 500 twice daily with no seizure events since last seen  Patient to continue Keppra at current meds gets  meds from the [place] in 6-8 months Dennie Bible, [facility], [facility], [facility] [address], [place], [place] [address] [phone]

## 2014-05-22 ENCOUNTER — Encounter (INDEPENDENT_AMBULATORY_CARE_PROVIDER_SITE_OTHER): Payer: Self-pay

## 2014-05-22 ENCOUNTER — Encounter: Payer: Self-pay | Admitting: Nurse Practitioner

## 2014-05-22 ENCOUNTER — Ambulatory Visit (INDEPENDENT_AMBULATORY_CARE_PROVIDER_SITE_OTHER): Payer: Medicare Other | Admitting: Nurse Practitioner

## 2014-05-22 VITALS — BP 147/74 | HR 75 | Wt 173.0 lb

## 2014-05-22 DIAGNOSIS — G459 Transient cerebral ischemic attack, unspecified: Secondary | ICD-10-CM

## 2014-05-22 DIAGNOSIS — I639 Cerebral infarction, unspecified: Secondary | ICD-10-CM

## 2014-05-22 DIAGNOSIS — R569 Unspecified convulsions: Secondary | ICD-10-CM

## 2014-05-22 NOTE — Patient Instructions (Signed)
Continue Keppra at current dose Continue Plavix for secondary stroke prevention Followup yearly and when necessary Call for any seizure activity

## 2014-05-22 NOTE — Progress Notes (Signed)
I have read the note, and I agree with the clinical assessment and plan.  Malijah Lietz KEITH   

## 2014-05-22 NOTE — Progress Notes (Signed)
[facility]  PATIENT: [name] DOB: Dec 23, 1928   REASON FOR VISIT: Followup for seizure disorder and history of stroke   HISTORY OF PRESENT ILLNESS:[name], 80 year old male returns for followup. He was last seen in this office 11/20/13.He suffered a seizure in December 2014. He has a history of right parietal stroke with resulting left hemiparesis and sensory deficit on the left he also has a chronic gait disorder. He is ambulating with cane today, no fall since last seen, he is currently on Keppra 500 twice a day without further seizure events, he gets his medications through the [place]. He has not gone back to driving because his family does not feel it is safe for him to drive. He returns for reevaluation    HISTORY: of cerebrovascular disease. In 1999, the patient suffered a moderate size right parietal stroke event. The patient has been left with a left hemiparesis and a sensory deficit on the left. The patient has a chronic gait disorder. The patient has been generally well until 05/01/2013. The patient had an event of left arm jerking and slurred speech. The patient was not placed on seizure medications at that time. The patient had another event on 07/12/2013. Once again, the patient began jerking on the left hand, and then this spread to involve the upper arm, and was associated with some slurred speech. The patient had generalization of the seizure after he got to the emergency room. MRI evaluation did not show a new stroke event. This report and the images were reviewed on line. The patient has a chronic right parietal stroke, and chronic left cerebellar stroke. The patient had some changes and increasing weakness of the left side following the seizure. The patient was treated with Keppra. The patient has done well since coming out of the hospital. The patient is getting therapy with physical and occupational therapy. The patient is using a walker for  ambulation. The patient denies headache or visual field changes. The patient was operating motor vehicle prior to the second seizure event. The patient has not driven since. The patient is sent to this office for an evaluation.      REVIEW OF SYSTEMS: Full 14 system review of systems performed and notable only for those listed, all others are neg:  Constitutional: N/A  Cardiovascular: leg swelling Ear/Nose/Throat: N/A  Skin: N/A  Eyes: N/A  Respiratory: N/A  Gastroitestinal: N/A  Hematology/Lymphatic: Easy bruising due to Plavix Endocrine: N/A Musculoskeletal: Walking difficulty  Allergy/Immunology: N/A  Neurological: N/A Psychiatric: N/A Sleep : NA   ALLERGIES: Allergies  Allergen Reactions  . Ativan [Lorazepam] Other (See Comments)    MOOD CHANGE    HOME MEDICATIONS: Outpatient Prescriptions Prior to Visit  Medication Sig Dispense Refill  . aspirin EC 81 MG tablet Take 81 mg by mouth daily.      Marland Kitchen BIOTIN PO Take 1 tablet by mouth daily.      . Cholecalciferol (VITAMIN D PO) Take 1 tablet by mouth daily.      . clopidogrel (PLAVIX) 75 MG tablet Take 75 mg by mouth daily with breakfast.      . finasteride (PROSCAR) 5 MG tablet Take 5 mg by mouth daily.      Marland Kitchen levETIRAcetam (KEPPRA) 500 MG tablet Take 1 tablet (500 mg total) by mouth 2 (two) times daily.  180 tablet  3  . losartan (COZAAR) 50 MG tablet Take 25 mg by mouth daily.      . Multiple Vitamin (MULTIVITAMIN WITH  MINERALS) TABS tablet Take 1 tablet by mouth daily.      . Omega-3 Fatty Acids (OMEGA 3 PO) Take 1 capsule by mouth daily.      . pantoprazole (PROTONIX) 40 MG tablet Take 40 mg by mouth daily.      . pravastatin (PRAVACHOL) 20 MG tablet Take 20 mg by mouth at bedtime.      Marland Kitchen VITAMIN E PO Take 1 capsule by mouth daily.       No facility-administered medications prior to visit.    PAST MEDICAL HISTORY: Past Medical History  Diagnosis Date  . Stroke     04/20/1998  . TIA (transient ischemic attack)     . Seizures     05/2013  . Hypertension   . High cholesterol   . Cancer   . Prostate cancer     Radium seed implants  . Dyslipidemia   . History of cerebrovascular disease     Right parietal, left cerebellar strokes, chronic    PAST SURGICAL HISTORY: Past Surgical History  Procedure Laterality Date  . Abdominal hernia repair    . Cataract extraction Bilateral   . Tonsillectomy    . Radium seed implants      Prostate cancer  . Carotid endarterectomy      Bilateral    FAMILY HISTORY: Family History  Problem Relation Age of Onset  . Heart failure Brother   . Heart disease Brother   . Heart failure Mother   . Alcoholism Father   . Diabetes Daughter   . Diabetes Brother   . Heart disease Brother   . Heart disease Brother   . Diabetes Sister     SOCIAL HISTORY: History   Social History  . Marital Status: Married    Spouse Name: N/A    Number of Children: 2    . Years of Education: college 2   Occupational History  . Retired    Social History Main Topics  . Smoking status: Never Smoker   . Smokeless tobacco: Never Used  . Alcohol Use: No  . Drug Use: No  . Sexual Activity: Not on file   Other Topics Concern  . Not on file   Social History Narrative   Married, 2 children 1 deceased   Right handed   2 yr college   occ tea     PHYSICAL EXAM  Filed Vitals:   05/22/14 0914  BP: 147/74  Pulse: 75  Weight: 173 lb (78.472 kg)   Body mass index is 25.19 kg/(m^2). Generalized: Well developed, in no acute distress  Head: normocephalic and atraumatic,. Oropharynx benign  Neck: Supple, no carotid bruits  Cardiac: Regular rate rhythm, no murmur  Musculoskeletal: Incomplete abduction and range of motion of the right shoulder some restriction on the left shoulder to a lesser degree.  Neurological examination  Mentation: Alert oriented to time, place, history taking. Follows all commands speech and language fluent  Cranial nerve II-XII: Pupils were equal  round reactive to light extraocular movements were full, visual field were full on confrontational test. Facial sensation and strength were normal. hearing was intact to finger rubbing bilaterally. Uvula tongue midline. head turning and shoulder shrug were normal and symmetric.Tongue protrusion into cheek strength was normal.  Motor: normal bulk and tone, full strength in the BUE, BLE, in the right extremities. 4/5 strength of the intrinsic muscles of the hand in the proximal muscles of the left arm, 4/5 strength proximally in the left leg. Foot drop on  the left. Increased motor tone is noted on left arm and leg  Sensory: normal and symmetric to light touch, pinprick, and vibration on the right, decreased pinprick sensation, decreased vibratory sensation to the left arm and leg.  Coordination: finger-nose-finger, heel-to-shin bilaterally, mild dysmetria with finger to nose finger of the left arm  Reflexes: Increased on the left arm and leg, normal on the right ,plantar responses were flexor bilaterally.  Gait and Station: Rising up from seated position with assistance, wide based and steady circumduction gait with the left leg, ambulates with single-point cane. Tandem gait not attempted   DIAGNOSTIC DATA (LABS, IMAGING, TESTING) - I reviewed patient records, labs, notes, testing and imaging myself where available.  Lab Results  Component Value Date   WBC 7.3 07/13/2013   HGB 12.9* 07/13/2013   HCT 37.0* 07/13/2013   MCV 93.2 07/13/2013   PLT 215 07/13/2013      Component Value Date/Time   NA 137 07/13/2013 0515   K 3.6 07/13/2013 0515   CL 103 07/13/2013 0515   CO2 26 07/13/2013 0515   GLUCOSE 92 07/13/2013 0515   BUN 13 07/13/2013 0515   CREATININE 0.79 07/13/2013 0515   CALCIUM 8.8 07/13/2013 0515   PROT 7.4 07/12/2013 1010   ALBUMIN 4.0 07/12/2013 1010   AST 22 07/12/2013 1010   ALT 15 07/12/2013 1010   ALKPHOS 84 07/12/2013 1010   BILITOT 0.5 07/12/2013 1010   GFRNONAA 81* 07/13/2013 0515   GFRAA  >90 07/13/2013 0515   Lab Results  Component Value Date   CHOL 155 07/13/2013   HDL 51 07/13/2013   LDLCALC 86 07/13/2013   TRIG 91 07/13/2013   CHOLHDL 3.0 07/13/2013   Lab Results  Component Value Date   HGBA1C 5.8* 07/13/2013    ASSESSMENT AND PLAN  80 y.o. year old male  has a past medical history of Stroke; TIA (transient ischemic attack); Seizures; Hypertension; High cholesterol; here to followup. He has not had recurrent seizure activity.  Continue Keppra at current dose meds obtained from [place] Continue Plavix for secondary stroke prevention Followup yearly and when necessary Call for any seizure activity Dennie Bible, [facility], [facility], APRN  [facility] [address], [place], [place] [address] [phone]

## 2014-05-28 ENCOUNTER — Ambulatory Visit: Payer: Medicare Other | Admitting: Nurse Practitioner

## 2014-06-25 ENCOUNTER — Encounter: Payer: Self-pay | Admitting: Neurology

## 2014-07-01 ENCOUNTER — Encounter: Payer: Self-pay | Admitting: Neurology

## 2015-05-28 ENCOUNTER — Ambulatory Visit: Payer: Medicare Other | Admitting: Neurology

## 2015-09-15 ENCOUNTER — Ambulatory Visit (INDEPENDENT_AMBULATORY_CARE_PROVIDER_SITE_OTHER): Payer: Medicare Other | Admitting: Neurology

## 2015-09-15 ENCOUNTER — Encounter: Payer: Self-pay | Admitting: Neurology

## 2015-09-15 VITALS — BP 142/68 | HR 68 | Ht 68.0 in | Wt 173.0 lb

## 2015-09-15 DIAGNOSIS — I69320 Aphasia following cerebral infarction: Secondary | ICD-10-CM | POA: Diagnosis not present

## 2015-09-15 DIAGNOSIS — I69359 Hemiplegia and hemiparesis following cerebral infarction affecting unspecified side: Secondary | ICD-10-CM | POA: Diagnosis not present

## 2015-09-15 DIAGNOSIS — M21372 Foot drop, left foot: Secondary | ICD-10-CM | POA: Insufficient documentation

## 2015-09-15 DIAGNOSIS — I69391 Dysphagia following cerebral infarction: Secondary | ICD-10-CM

## 2015-09-15 DIAGNOSIS — R269 Unspecified abnormalities of gait and mobility: Secondary | ICD-10-CM

## 2015-09-15 DIAGNOSIS — R569 Unspecified convulsions: Secondary | ICD-10-CM

## 2015-09-15 HISTORY — DX: Unspecified abnormalities of gait and mobility: R26.9

## 2015-09-15 HISTORY — DX: Foot drop, left foot: M21.372

## 2015-09-15 HISTORY — DX: Aphasia following cerebral infarction: I69.320

## 2015-09-15 HISTORY — DX: Aphasia following cerebral infarction: I69.359

## 2015-09-15 MED ORDER — BACLOFEN 10 MG PO TABS: 5.0000 mg | ORAL_TABLET | Freq: Three times a day (TID) | ORAL | Status: DC

## 2015-09-15 NOTE — Patient Instructions (Signed)
Fall Prevention in the Home  Falls can cause injuries and can affect people from all age groups. There are many simple things that you can do to make your home safe and to help prevent falls. WHAT CAN I DO ON THE OUTSIDE OF MY HOME?  Regularly repair the edges of walkways and driveways and fix any cracks.  Remove high doorway thresholds.  Trim any shrubbery on the main path into your home.  Use bright outdoor lighting.  Clear walkways of debris and clutter, including tools and rocks.  Regularly check that handrails are securely fastened and in good repair. Both sides of any steps should have handrails.  Install guardrails along the edges of any raised decks or porches.  Have leaves, snow, and ice cleared regularly.  Use sand or salt on walkways during winter months.  In the garage, clean up any spills right away, including grease or oil spills. WHAT CAN I DO IN THE BATHROOM?  Use night lights.  Install grab bars by the toilet and in the tub and shower. Do not use towel bars as grab bars.  Use non-skid mats or decals on the floor of the tub or shower.  If you need to sit down while you are in the shower, use a plastic, non-slip stool..  Keep the floor dry. Immediately clean up any water that spills on the floor.  Remove soap buildup in the tub or shower on a regular basis.  Attach bath mats securely with double-sided non-slip rug tape.  Remove throw rugs and other tripping hazards from the floor. WHAT CAN I DO IN THE BEDROOM?  Use night lights.  Make sure that a bedside light is easy to reach.  Do not use oversized bedding that drapes onto the floor.  Have a firm chair that has side arms to use for getting dressed.  Remove throw rugs and other tripping hazards from the floor. WHAT CAN I DO IN THE KITCHEN?   Clean up any spills right away.  Avoid walking on wet floors.  Place frequently used items in easy-to-reach places.  If you need to reach for something  above you, use a sturdy step stool that has a grab bar.  Keep electrical cables out of the way.  Do not use floor polish or wax that makes floors slippery. If you have to use wax, make sure that it is non-skid floor wax.  Remove throw rugs and other tripping hazards from the floor. WHAT CAN I DO IN THE STAIRWAYS?  Do not leave any items on the stairs.  Make sure that there are handrails on both sides of the stairs. Fix handrails that are broken or loose. Make sure that handrails are as long as the stairways.  Check any carpeting to make sure that it is firmly attached to the stairs. Fix any carpet that is loose or worn.  Avoid having throw rugs at the top or bottom of stairways, or secure the rugs with carpet tape to prevent them from moving.  Make sure that you have a light switch at the top of the stairs and the bottom of the stairs. If you do not have them, have them installed. WHAT ARE SOME OTHER FALL PREVENTION TIPS?  Wear closed-toe shoes that fit well and support your feet. Wear shoes that have rubber soles or low heels.  When you use a stepladder, make sure that it is completely opened and that the sides are firmly locked. Have someone hold the ladder while you   are using it. Do not climb a closed stepladder.  Add color or contrast paint or tape to grab bars and handrails in your home. Place contrasting color strips on the first and last steps.  Use mobility aids as needed, such as canes, walkers, scooters, and crutches.  Turn on lights if it is dark. Replace any light bulbs that burn out.  Set up furniture so that there are clear paths. Keep the furniture in the same spot.  Fix any uneven floor surfaces.  Choose a carpet design that does not hide the edge of steps of a stairway.  Be aware of any and all pets.  Review your medicines with your healthcare provider. Some medicines can cause dizziness or changes in blood pressure, which increase your risk of falling. Talk  with your health care provider about other ways that you can decrease your risk of falls. This may include working with a physical therapist or trainer to improve your strength, balance, and endurance.   This information is not intended to replace advice given to you by your health care provider. Make sure you discuss any questions you have with your health care provider.   Document Released: 07/15/2002 Document Revised: 12/09/2014 Document Reviewed: 08/29/2014 Elsevier Interactive Patient Education 2016 [employer]vier Inc.  

## 2015-09-15 NOTE — Progress Notes (Signed)
Reason for visit: Seizures  Dwright Habel is an 82 y.o. male  History of present illness:  Mr. Lockler is an 82 year old right-handed white male with a history of a prior stroke affecting the right brain with left hemiparesis. The patient has significant sensory deficits on the left as well, he is virtually anesthetic on the left. The patient has a gait disorder associated with the hemiparesis with significant increase in motor tone. Compounding this problem is that he has bilateral frozen shoulders, unable to lift either arm much past 45. The patient reports a gait disorder, he may fall on occasion. He has a footdrop on the left, he has a PAFO that he does not use most of the time as the device is somewhat uncomfortable. The patient may catch his toe and fall on occasion. The patient does have significant stiffness of the left arm and left leg. He reports that he has done well with the seizures, he has not had any seizures in at least 2 or 3 years. The patient comes to this office for an evaluation. He may operate a motor vehicle on rare occasions, usually he does not drive. The patient indicates that he is tolerating the [place] well, he does not have drowsiness or irritability on the medication.  Past Medical History  Diagnosis Date  . Stroke ([place])     04/20/1998  . TIA (transient ischemic attack)   . Seizures ([place])     05/2013  . Hypertension   . High cholesterol   . Cancer ([place])   . Prostate cancer ([place])     Radium seed implants  . Dyslipidemia   . History of cerebrovascular disease     Right parietal, left cerebellar strokes, chronic  . Abnormality of gait 09/15/2015  . Hemiparesis, aphasia, and dysphagia as late effects of stroke ([place]) 09/15/2015  . Foot drop, left 09/15/2015    Past Surgical History  Procedure Laterality Date  . Abdominal hernia repair    . Cataract extraction Bilateral   . Tonsillectomy    . Radium seed implants      Prostate cancer  . Carotid  endarterectomy      Bilateral    Family History  Problem Relation Age of Onset  . Heart failure Brother   . Heart disease Brother   . Heart failure Mother   . Alcoholism Father   . Diabetes Daughter   . Diabetes Brother   . Heart disease Brother   . Heart disease Brother   . Diabetes Sister     Social history:  reports that he has never smoked. He has never used smokeless tobacco. He reports that he does not drink alcohol or use illicit drugs.    Allergies  Allergen Reactions  . Ativan [Lorazepam] Other (See Comments)    MOOD CHANGE    Medications:  Prior to Admission medications   Medication Sig Start Date End Date Taking? Authorizing Provider  aspirin EC 81 MG tablet Take 81 mg by mouth daily.   Yes Historical Provider, MD  BIOTIN PO Take 1 tablet by mouth daily.   Yes Historical Provider, MD  Cholecalciferol (VITAMIN D PO) Take 1 tablet by mouth daily.   Yes Historical Provider, MD  clopidogrel (PLAVIX) 75 MG tablet Take 75 mg by mouth daily with breakfast.   Yes Historical Provider, MD  finasteride (PROSCAR) 5 MG tablet Take 5 mg by mouth daily.   Yes Historical Provider, MD  levETIRAcetam (KEPPRA) 500 MG tablet Take 1  tablet (500 mg total) by mouth 2 (two) times daily. 07/28/13  Yes Kathrynn Ducking, MD  losartan (COZAAR) 50 MG tablet Take 25 mg by mouth daily.   Yes Historical Provider, MD  Multiple Vitamin (MULTIVITAMIN WITH MINERALS) TABS tablet Take 1 tablet by mouth daily.   Yes Historical Provider, MD  Omega-3 Fatty Acids (OMEGA 3 PO) Take 1 capsule by mouth daily.   Yes Historical Provider, MD  pantoprazole (PROTONIX) 40 MG tablet Take 40 mg by mouth daily.   Yes Historical Provider, MD  pravastatin (PRAVACHOL) 20 MG tablet Take 20 mg by mouth at bedtime.   Yes Historical Provider, MD    ROS:  Out of a complete 14 system review of symptoms, the patient complains only of the following symptoms, and all other reviewed systems are negative.  Walking  difficulty  Blood pressure 142/68, pulse 68, height 5\' 8"  (1.727 m), weight 173 lb (78.472 kg).  Physical Exam  General: The patient is alert and cooperative at the time of the examination.  Skin: No significant peripheral edema is noted.   Neurologic Exam  Mental status: The patient is alert and oriented x 3 at the time of the examination. The patient has apparent normal recent and remote memory, with an apparently normal attention span and concentration ability.   Cranial nerves: Facial symmetry is present. Speech is normal, no aphasia or dysarthria is noted. Extraocular movements are full. Visual fields are full.  Motor: The patient has good strength in the right extremities. On the left side, there is a significant increase in motor tone of the left leg and left arm, fingers are in flexion on the left hand with cortical thumbing. The patient holds the left arm in flexion. There is incomplete abduction of both arms, the patient's only able to get to about 45 with abduction on both arms.  Sensory examination: Soft touch sensation is relatively unremarkable on the right, the patient has significant impairment of sensation on the left side including the face, arm, and leg.  Coordination: The patient has good finger-nose-finger and heel-to-shin on the right, the patient has the ability to perform on the left side as well with more difficulty.  Gait and station: The patient has a circumduction gait with the left leg, the patient uses a cane for an ambulation. He may catch his toe on occasion and stumble. With walking, the toe is dropped and inverted.  Reflexes: Deep tendon reflexes are slightly elevated on the left arm and leg.   Assessment/Plan:  1. History of right brain stroke, left hemiparesis  2. Gait disorder  3. History of seizures, well controlled  The patient has a significant degree of spasticity involving the left leg and left arm with an associated left footdrop. The  patient has a PAFO brace that is uncomfortable for him, he does not use the brace regularly. He is at risk for falling. I have written a prescription for an AFO brace in hopes of getting a carbon fiber brace that is light weight and more comfortable. This should help with his safety and prevent the foot drop and inversion of the left foot. The patient will be placed on baclofen for spasticity starting at 5 mg 3 times daily. He will contact our office if he believes that he is able to tolerate the medication but requires a higher dose. The patient will remain on Keppra, he gets his medications through the [facility]. He will follow-up in one year, sooner if needed.  Jill Alexanders MD 09/15/2015 9:15 PM  [facility] [address] [place], [place] [address]  Phone [phone] Fax [phone]

## 2015-10-05 DIAGNOSIS — L570 Actinic keratosis: Secondary | ICD-10-CM | POA: Diagnosis not present

## 2015-10-08 DIAGNOSIS — E559 Vitamin D deficiency, unspecified: Secondary | ICD-10-CM | POA: Diagnosis not present

## 2015-10-08 DIAGNOSIS — I6781 Acute cerebrovascular insufficiency: Secondary | ICD-10-CM | POA: Diagnosis not present

## 2015-10-08 DIAGNOSIS — I10 Essential (primary) hypertension: Secondary | ICD-10-CM | POA: Diagnosis not present

## 2015-10-08 DIAGNOSIS — K2270 Barrett's esophagus without dysplasia: Secondary | ICD-10-CM | POA: Diagnosis not present

## 2015-10-08 DIAGNOSIS — Q249 Congenital malformation of heart, unspecified: Secondary | ICD-10-CM | POA: Diagnosis not present

## 2015-10-08 DIAGNOSIS — G40909 Epilepsy, unspecified, not intractable, without status epilepticus: Secondary | ICD-10-CM | POA: Diagnosis not present

## 2015-10-08 DIAGNOSIS — Z9181 History of falling: Secondary | ICD-10-CM | POA: Diagnosis not present

## 2015-10-08 DIAGNOSIS — Z1389 Encounter for screening for other disorder: Secondary | ICD-10-CM | POA: Diagnosis not present

## 2015-10-08 DIAGNOSIS — Z0000 Encounter for general adult medical examination without abnormal findings: Secondary | ICD-10-CM | POA: Diagnosis not present

## 2015-10-08 DIAGNOSIS — E785 Hyperlipidemia, unspecified: Secondary | ICD-10-CM | POA: Diagnosis not present

## 2015-10-09 DIAGNOSIS — Z0000 Encounter for general adult medical examination without abnormal findings: Secondary | ICD-10-CM | POA: Diagnosis not present

## 2015-10-09 DIAGNOSIS — E559 Vitamin D deficiency, unspecified: Secondary | ICD-10-CM | POA: Diagnosis not present

## 2015-10-09 DIAGNOSIS — E785 Hyperlipidemia, unspecified: Secondary | ICD-10-CM | POA: Diagnosis not present

## 2016-02-03 DIAGNOSIS — R351 Nocturia: Secondary | ICD-10-CM | POA: Diagnosis not present

## 2016-02-03 DIAGNOSIS — C61 Malignant neoplasm of prostate: Secondary | ICD-10-CM | POA: Diagnosis not present

## 2016-04-12 DIAGNOSIS — E559 Vitamin D deficiency, unspecified: Secondary | ICD-10-CM | POA: Diagnosis not present

## 2016-04-12 DIAGNOSIS — Z6825 Body mass index (BMI) 25.0-25.9, adult: Secondary | ICD-10-CM | POA: Diagnosis not present

## 2016-04-12 DIAGNOSIS — I10 Essential (primary) hypertension: Secondary | ICD-10-CM | POA: Diagnosis not present

## 2016-04-12 DIAGNOSIS — C61 Malignant neoplasm of prostate: Secondary | ICD-10-CM | POA: Diagnosis not present

## 2016-04-12 DIAGNOSIS — M1990 Unspecified osteoarthritis, unspecified site: Secondary | ICD-10-CM | POA: Diagnosis not present

## 2016-04-12 DIAGNOSIS — E785 Hyperlipidemia, unspecified: Secondary | ICD-10-CM | POA: Diagnosis not present

## 2016-04-12 DIAGNOSIS — K2270 Barrett's esophagus without dysplasia: Secondary | ICD-10-CM | POA: Diagnosis not present

## 2016-06-24 DIAGNOSIS — Z23 Encounter for immunization: Secondary | ICD-10-CM | POA: Diagnosis not present

## 2016-07-13 DIAGNOSIS — M1990 Unspecified osteoarthritis, unspecified site: Secondary | ICD-10-CM | POA: Diagnosis not present

## 2016-07-13 DIAGNOSIS — K2270 Barrett's esophagus without dysplasia: Secondary | ICD-10-CM | POA: Diagnosis not present

## 2016-07-13 DIAGNOSIS — R21 Rash and other nonspecific skin eruption: Secondary | ICD-10-CM | POA: Diagnosis not present

## 2016-07-13 DIAGNOSIS — I6781 Acute cerebrovascular insufficiency: Secondary | ICD-10-CM | POA: Diagnosis not present

## 2016-07-13 DIAGNOSIS — Z6826 Body mass index (BMI) 26.0-26.9, adult: Secondary | ICD-10-CM | POA: Diagnosis not present

## 2016-07-13 DIAGNOSIS — Q249 Congenital malformation of heart, unspecified: Secondary | ICD-10-CM | POA: Diagnosis not present

## 2016-07-13 DIAGNOSIS — E559 Vitamin D deficiency, unspecified: Secondary | ICD-10-CM | POA: Diagnosis not present

## 2016-07-13 DIAGNOSIS — I10 Essential (primary) hypertension: Secondary | ICD-10-CM | POA: Diagnosis not present

## 2016-07-13 DIAGNOSIS — E785 Hyperlipidemia, unspecified: Secondary | ICD-10-CM | POA: Diagnosis not present

## 2016-07-18 ENCOUNTER — Telehealth: Payer: Self-pay | Admitting: Neurology

## 2016-07-18 NOTE — Telephone Encounter (Signed)
Patient is calling stating he has been having problems with his speech for the last 2 weeks. He has an appointment with Jinny Blossom on 09-14-16 but does he need to be seen sooner for this condition? Does he needs an MRI? Please call and advise.

## 2016-08-04 DIAGNOSIS — C61 Malignant neoplasm of prostate: Secondary | ICD-10-CM | POA: Diagnosis not present

## 2016-08-04 DIAGNOSIS — N3289 Other specified disorders of bladder: Secondary | ICD-10-CM | POA: Diagnosis not present

## 2016-09-14 ENCOUNTER — Ambulatory Visit: Payer: Medicare Other | Admitting: Adult Health

## 2016-09-16 ENCOUNTER — Emergency Department (HOSPITAL_COMMUNITY): Payer: Medicare Other

## 2016-09-16 ENCOUNTER — Observation Stay (HOSPITAL_COMMUNITY)
Admission: EM | Admit: 2016-09-16 | Discharge: 2016-09-17 | Disposition: A | Discharge status: Home / Self Care | Payer: Medicare Other | Attending: Family Medicine | Admitting: Family Medicine

## 2016-09-16 ENCOUNTER — Encounter (HOSPITAL_COMMUNITY): Payer: Self-pay | Admitting: Emergency Medicine

## 2016-09-16 DIAGNOSIS — E7800 Pure hypercholesterolemia, unspecified: Secondary | ICD-10-CM | POA: Diagnosis not present

## 2016-09-16 DIAGNOSIS — R569 Unspecified convulsions: Secondary | ICD-10-CM

## 2016-09-16 DIAGNOSIS — I69354 Hemiplegia and hemiparesis following cerebral infarction affecting left non-dominant side: Secondary | ICD-10-CM | POA: Insufficient documentation

## 2016-09-16 DIAGNOSIS — Z7902 Long term (current) use of antithrombotics/antiplatelets: Secondary | ICD-10-CM | POA: Diagnosis not present

## 2016-09-16 DIAGNOSIS — G40909 Epilepsy, unspecified, not intractable, without status epilepticus: Principal | ICD-10-CM

## 2016-09-16 DIAGNOSIS — R4182 Altered mental status, unspecified: Secondary | ICD-10-CM | POA: Diagnosis present

## 2016-09-16 DIAGNOSIS — Z8673 Personal history of transient ischemic attack (TIA), and cerebral infarction without residual deficits: Secondary | ICD-10-CM

## 2016-09-16 DIAGNOSIS — Z7982 Long term (current) use of aspirin: Secondary | ICD-10-CM | POA: Insufficient documentation

## 2016-09-16 DIAGNOSIS — I10 Essential (primary) hypertension: Secondary | ICD-10-CM | POA: Diagnosis not present

## 2016-09-16 DIAGNOSIS — E785 Hyperlipidemia, unspecified: Secondary | ICD-10-CM | POA: Insufficient documentation

## 2016-09-16 DIAGNOSIS — I639 Cerebral infarction, unspecified: Secondary | ICD-10-CM | POA: Diagnosis not present

## 2016-09-16 DIAGNOSIS — E871 Hypo-osmolality and hyponatremia: Secondary | ICD-10-CM | POA: Diagnosis not present

## 2016-09-16 DIAGNOSIS — Z79899 Other long term (current) drug therapy: Secondary | ICD-10-CM | POA: Insufficient documentation

## 2016-09-16 LAB — COMPREHENSIVE METABOLIC PANEL
ALBUMIN: 4.1 g/dL (ref 3.5–5.0)
ALK PHOS: 67 U/L (ref 38–126)
ALT: 22 U/L (ref 17–63)
AST: 26 U/L (ref 15–41)
Anion gap: 9 (ref 5–15)
BUN: 19 mg/dL (ref 6–20)
CHLORIDE: 94 mmol/L — AB (ref 101–111)
CO2: 27 mmol/L (ref 22–32)
CREATININE: 1.16 mg/dL (ref 0.61–1.24)
Calcium: 9.8 mg/dL (ref 8.9–10.3)
GFR calc non Af Amer: 56 mL/min — ABNORMAL LOW (ref >60)
GLUCOSE: 104 mg/dL — AB (ref 65–99)
Potassium: 4.6 mmol/L (ref 3.5–5.1)
SODIUM: 130 mmol/L — AB (ref 135–145)
Total Bilirubin: 0.8 mg/dL (ref 0.3–1.2)
Total Protein: 7.2 g/dL (ref 6.5–8.1)

## 2016-09-16 LAB — DIFFERENTIAL
BASOS ABS: 0 10*3/uL (ref 0.0–0.1)
BASOS PCT: 0 %
Eosinophils Absolute: 0.2 10*3/uL (ref 0.0–0.7)
Eosinophils Relative: 3 %
LYMPHS PCT: 19 %
Lymphs Abs: 1.4 10*3/uL (ref 0.7–4.0)
Monocytes Absolute: 0.7 10*3/uL (ref 0.1–1.0)
Monocytes Relative: 10 %
NEUTROS ABS: 5.2 10*3/uL (ref 1.7–7.7)
NEUTROS PCT: 68 %

## 2016-09-16 LAB — CBC
HCT: 38.5 % — ABNORMAL LOW (ref 39.0–52.0)
Hemoglobin: 13.4 g/dL (ref 13.0–17.0)
MCH: 32.7 pg (ref 26.0–34.0)
MCHC: 34.8 g/dL (ref 30.0–36.0)
MCV: 93.9 fL (ref 78.0–100.0)
PLATELETS: 269 10*3/uL (ref 150–400)
RBC: 4.1 MIL/uL — AB (ref 4.22–5.81)
RDW: 11.8 % (ref 11.5–15.5)
WBC: 7.5 10*3/uL (ref 4.0–10.5)

## 2016-09-16 LAB — I-STAT CHEM 8, ED
BUN: 25 mg/dL — ABNORMAL HIGH (ref 6–20)
CHLORIDE: 92 mmol/L — AB (ref 101–111)
CREATININE: 1.1 mg/dL (ref 0.61–1.24)
Calcium, Ion: 1.19 mmol/L (ref 1.15–1.40)
Glucose, Bld: 101 mg/dL — ABNORMAL HIGH (ref 65–99)
HEMATOCRIT: 41 % (ref 39.0–52.0)
HEMOGLOBIN: 13.9 g/dL (ref 13.0–17.0)
Potassium: 4.8 mmol/L (ref 3.5–5.1)
Sodium: 130 mmol/L — ABNORMAL LOW (ref 135–145)
TCO2: 30 mmol/L (ref 0–100)

## 2016-09-16 LAB — I-STAT TROPONIN, ED: Troponin i, poc: 0 ng/mL (ref 0.00–0.08)

## 2016-09-16 LAB — PROTIME-INR
INR: 1.05
PROTHROMBIN TIME: 13.8 s (ref 11.4–15.2)

## 2016-09-16 LAB — APTT: APTT: 44 s — AB (ref 24–36)

## 2016-09-16 LAB — CBG MONITORING, ED: Glucose-Capillary: 86 mg/dL (ref 65–99)

## 2016-09-16 MED ORDER — PRAVASTATIN SODIUM 20 MG PO TABS
20.0000 mg | ORAL_TABLET | Freq: Every day | ORAL | Status: DC
  Administered 2016-09-16: 20 mg via ORAL
  Filled 2016-09-16: qty 1

## 2016-09-16 MED ORDER — PANTOPRAZOLE SODIUM 40 MG PO TBEC
40.0000 mg | DELAYED_RELEASE_TABLET | Freq: Every day | ORAL | Status: DC
  Administered 2016-09-16 – 2016-09-17 (×2): 40 mg via ORAL
  Filled 2016-09-16 (×2): qty 1

## 2016-09-16 MED ORDER — ACETAMINOPHEN 325 MG PO TABS: 650.0000 mg | ORAL_TABLET | Freq: Four times a day (QID) | ORAL | Status: DC | PRN

## 2016-09-16 MED ORDER — FINASTERIDE 5 MG PO TABS
5.0000 mg | ORAL_TABLET | Freq: Every day | ORAL | Status: DC
  Administered 2016-09-16 – 2016-09-17 (×2): 5 mg via ORAL
  Filled 2016-09-16 (×2): qty 1

## 2016-09-16 MED ORDER — POLYETHYLENE GLYCOL 3350 17 G PO PACK: 17.0000 g | PACK | Freq: Every day | ORAL | Status: DC | PRN

## 2016-09-16 MED ORDER — SODIUM CHLORIDE 0.9 % IV BOLUS (SEPSIS)
1000.0000 mL | Freq: Once | INTRAVENOUS | Status: AC
  Administered 2016-09-16: 1000 mL via INTRAVENOUS

## 2016-09-16 MED ORDER — LOSARTAN POTASSIUM 50 MG PO TABS
25.0000 mg | ORAL_TABLET | Freq: Every day | ORAL | Status: DC
  Administered 2016-09-16 – 2016-09-17 (×2): 25 mg via ORAL
  Filled 2016-09-16 (×2): qty 1

## 2016-09-16 MED ORDER — CLOPIDOGREL BISULFATE 75 MG PO TABS
75.0000 mg | ORAL_TABLET | Freq: Every evening | ORAL | Status: DC
  Administered 2016-09-16: 75 mg via ORAL
  Filled 2016-09-16: qty 1

## 2016-09-16 MED ORDER — ACETAMINOPHEN 650 MG RE SUPP: 650.0000 mg | Freq: Four times a day (QID) | RECTAL | Status: DC | PRN

## 2016-09-16 MED ORDER — SODIUM CHLORIDE 0.9 % IV SOLN
1000.0000 mg | Freq: Once | INTRAVENOUS | Status: AC
  Administered 2016-09-16: 1000 mg via INTRAVENOUS
  Filled 2016-09-16: qty 10

## 2016-09-16 MED ORDER — LEVETIRACETAM 750 MG PO TABS
750.0000 mg | ORAL_TABLET | Freq: Two times a day (BID) | ORAL | Status: DC
  Administered 2016-09-16 – 2016-09-17 (×2): 750 mg via ORAL
  Filled 2016-09-16 (×2): qty 1

## 2016-09-16 MED ORDER — BACLOFEN 10 MG PO TABS
5.0000 mg | ORAL_TABLET | Freq: Three times a day (TID) | ORAL | Status: DC
  Administered 2016-09-16 – 2016-09-17 (×2): 5 mg via ORAL
  Filled 2016-09-16 (×2): qty 1

## 2016-09-16 MED ORDER — ASPIRIN EC 81 MG PO TBEC
81.0000 mg | DELAYED_RELEASE_TABLET | Freq: Every day | ORAL | Status: DC
  Administered 2016-09-17: 81 mg via ORAL
  Filled 2016-09-16: qty 1

## 2016-09-16 NOTE — Consult Note (Addendum)
Reason for Consult: Seizures Referring Physician:  ED  [name] is an 83 y.o. male.  HPI:  Pt has been having episodes of syncope with with drooling to the left side. It will last only a few minutes and resolve spontaneously. He then spontaneously wake up and start conversing however we'll be slightly confused. He had 2 episodes today at 11:30 and another one at close to 1 PM. Family reports he had 2 additional episodes about 2 weeks ago. He has history of right cerebral hemispheric stroke and also had seizures in the past. He takes Keppra 500 mg twice a day and has been compliant with his medications. At this time his family believes is back to his normal baseline. He does have left-sided weakness with contractures and uses cane for ambulation. He denied losing his bowel or bladder control or biting his tongue during these episodes.  Family reports his had approximately 4 episodes in the last 2 weeks.  Past Medical History:  Diagnosis Date  . Abnormality of gait 09/15/2015  . Cancer ([place])   . Dyslipidemia   . Foot drop, left 09/15/2015  . Hemiparesis, aphasia, and dysphagia as late effects of stroke ([place]) 09/15/2015  . High cholesterol   . History of cerebrovascular disease    Right parietal, left cerebellar strokes, chronic  . Hypertension   . Prostate cancer ([place])    Radium seed implants  . Seizures ([place])    05/2013  . Stroke ([place])    04/20/1998  . TIA (transient ischemic attack)     Past Surgical History:  Procedure Laterality Date  . ABDOMINAL HERNIA REPAIR    . CAROTID ENDARTERECTOMY     Bilateral  . CATARACT EXTRACTION Bilateral   . Radium seed implants     Prostate cancer  . TONSILLECTOMY      Family History  Problem Relation Age of Onset  . Heart failure Brother   . Heart disease Brother   . Heart failure Mother   . Alcoholism Father   . Diabetes Daughter   . Diabetes Brother   . Heart disease Brother   . Heart disease Brother   . Diabetes Sister      Social History:  reports that he has never smoked. He has never used smokeless tobacco. He reports that he does not drink alcohol or use drugs.  Allergies:  Allergies  Allergen Reactions  . Ativan [Lorazepam] Other (See Comments)    MOOD CHANGE; AGGRESSIVE  . Tape Other (See Comments)    SKIN IS VERY THIN AND WILL TEAR AND BRUISE EASILY!!    Medications: Prior to Admission:  (Not in a hospital admission) Scheduled: Continuous: PRN:  Results for orders placed or performed during the hospital encounter of 09/16/16 (from the past 48 hour(s))  CBC     Status: Abnormal   Collection Time: 09/16/16  2:55 PM  Result Value Ref Range   WBC 7.5 4.0 - 10.5 K/uL   RBC 4.10 (L) 4.22 - 5.81 MIL/uL   Hemoglobin 13.4 13.0 - 17.0 g/dL   HCT 38.5 (L) 39.0 - 52.0 %   MCV 93.9 78.0 - 100.0 fL   MCH 32.7 26.0 - 34.0 pg   MCHC 34.8 30.0 - 36.0 g/dL   RDW 11.8 11.5 - 15.5 %   Platelets 269 150 - 400 K/uL  Differential     Status: None   Collection Time: 09/16/16  2:55 PM  Result Value Ref Range   Neutrophils Relative % 68 %  Neutro Abs 5.2 1.7 - 7.7 K/uL   Lymphocytes Relative 19 %   Lymphs Abs 1.4 0.7 - 4.0 K/uL   Monocytes Relative 10 %   Monocytes Absolute 0.7 0.1 - 1.0 K/uL   Eosinophils Relative 3 %   Eosinophils Absolute 0.2 0.0 - 0.7 K/uL   Basophils Relative 0 %   Basophils Absolute 0.0 0.0 - 0.1 K/uL  CBG monitoring, ED     Status: None   Collection Time: 09/16/16  3:01 PM  Result Value Ref Range   Glucose-Capillary 86 65 - 99 mg/dL  I-stat troponin, ED     Status: None   Collection Time: 09/16/16  3:04 PM  Result Value Ref Range   Troponin i, poc 0.00 0.00 - 0.08 ng/mL   Comment 3            Comment: Due to the release kinetics of cTnI, a negative result within the first hours of the onset of symptoms does not rule out myocardial infarction with certainty. If myocardial infarction is still suspected, repeat the test at appropriate intervals.   I-Stat Chem 8, ED      Status: Abnormal   Collection Time: 09/16/16  3:06 PM  Result Value Ref Range   Sodium 130 (L) 135 - 145 mmol/L   Potassium 4.8 3.5 - 5.1 mmol/L   Chloride 92 (L) 101 - 111 mmol/L   BUN 25 (H) 6 - 20 mg/dL   Creatinine, Ser 1.10 0.61 - 1.24 mg/dL   Glucose, Bld 101 (H) 65 - 99 mg/dL   Calcium, Ion 1.19 1.15 - 1.40 mmol/L   TCO2 30 0 - 100 mmol/L   Hemoglobin 13.9 13.0 - 17.0 g/dL   HCT 41.0 39.0 - 52.0 %    Ct Head Code Stroke W/o Cm  Result Date: 09/16/2016 CLINICAL DATA:  Code stroke.  Slurred speech and facial droop. EXAM: CT HEAD WITHOUT CONTRAST TECHNIQUE: Contiguous axial images were obtained from the base of the skull through the vertex without intravenous contrast. COMPARISON:  09/27/2013 FINDINGS: Brain: A moderately large chronic infarct is again seen in the right frontal and parietal lobes with some involvement of the lateral right thalamus. Small chronic infarcts are again seen in the left frontal lobe, left parietal lobe, and bilateral cerebellum. The ventricles are unchanged in size. There is no evidence of acute cortical infarct, intracranial hemorrhage, mass, midline shift, or extra-axial fluid collection. Elsewhere, periventricular white matter T2 hyperintensities are similar to the prior study and compatible with moderate chronic small vessel ischemic disease. Vascular: Calcified atherosclerosis at the skullbase. Chronic calcified right MCA branch vessel embolus in the sylvian fissure. No hyperdense vessel. Skull: No fracture or focal osseous lesion. Sinuses/Orbits: The visualized paranasal sinuses are clear. There is a chronic sclerotic appearance of the right mastoid tip. Prior bilateral cataract extraction. Other: None. ASPECTS [facility] Stroke Program Early CT Score) - Ganglionic level infarction (caudate, lentiform nuclei, internal capsule, insula, M1-M3 cortex): 7 - Supraganglionic infarction (M4-M6 cortex): 3 Total score (0-10 with 10 being normal): 10 IMPRESSION: 1. No  evidence of acute intracranial abnormality. 2. ASPECTS is 10. 3. Chronic infarcts and chronic small vessel ischemic disease as above. These results were called by telephone at the time of interpretation on 09/16/2016 at 3:07 pm to Dr. Alver Fisher, who verbally acknowledged these results. Electronically Signed   By: Logan Bores M.D.   On: 09/16/2016 15:10    Review of Systems  Constitutional: Negative.   HENT: Negative.   Eyes: Negative.  Respiratory: Negative.   Cardiovascular: Negative.   Gastrointestinal: Negative.   Genitourinary: Negative.   Musculoskeletal: Negative.   Skin: Negative.   Neurological: Negative.   Endo/Heme/Allergies: Negative.   Psychiatric/Behavioral: Negative.    Blood pressure 160/72, pulse 69, temperature 98.1 F (36.7 C), temperature source Oral, resp. rate 17, height 5\' 9"  (1.753 m), weight 78 kg (171 lb 15.3 oz), SpO2 98 %. Physical Exam HEENT-  Normocephalic, no lesions, Neck supple and no rigidity was appreciated. Cardiovascular - regular rate and rhythm, S1, S2 normal, no murmur, click, rub or gallop Lungs - chest clear, no wheezing, rales, normal symmetric air entry, Heart exam - S1, S2 normal, no murmur, no gallop, rate regular Abdomen - soft nontender and nondistended  Neurologic Examination: Mental status: Awake alert and oriented to all spheres. Speech and language: No evidence of dysarthria was appreciated. Comprehension intact. Naming intact. Fluent. Cranial nerves: Pupils approximately 2 mm and reactive to light. Extra muscles intact. Facial sensation symmetric. No facial droop is appreciated. Hearing intact. Uvula midline. Tongue midline. Motor: Right side 5/5 throughout. Left upper extremity flexed and contracted however able to lift against gravity without a drift. Left lower tissue to 5/5. Sensory: Decreased sensation in left side to light touch. Coordination: Normal finger to nose Gait: Deferred  Assessment/Plan: 83 year old male with multiple  episodes of loss of consciousness with left-sided drooling and some confusion after awakening. Neurological examination at this time is back to his baseline with residual left sided weakness from previous stroke. Highly concerning for compress partial seizures.  -Admit for observation -1000 mg IV Keppra loading dose -Increase maintenance dose to 750 twice a day. -No need for EEG at this time however his change in mental status or regarding her symptoms may need a prolonged EEG monitoring.  Does not drive.  This patient is critically ill and at significant risk of neurological worsening, death and care requires constant monitoring of vital signs, hemodynamics,respiratory and cardiac monitoring, neurological assessment, discussion with family, other specialists and medical decision making of high complexity. I spent 60 minutes of neurocritical care time  in the care of  this patient.  09/16/2016  3:46 PM   Ike Bene Alver Fisher 09/16/2016, 3:36 PM

## 2016-09-16 NOTE — ED Triage Notes (Signed)
Per wife pt had episode of slurred speech onset today @ 11:30, pts symptoms resolved, pt reports return of slurred speech today @ 14: 10, airway intact, CODE STROKE activated at nurse first

## 2016-09-16 NOTE — ED Notes (Signed)
Report attempted 

## 2016-09-16 NOTE — Code Documentation (Signed)
83 y.o. Male presents to [facility] ED via POV. Pt with hx of seizure (on keppra) and prior CVA with Left sided sensory loss and LUE contracted. The patient was in the car with his family when at 67 he was noted to be sleeping. About 10 minutes away from there destination, the pt's wife checked on the pt and he wouldn't wake up. They then pulled over and the pt was unresponsive. About 10-15 min after onset, the family reports the pt woke up and stated he was ok and did not want EMS called. The family continued on there drive. Around 1350 the pt had another episode of unresponsives that lasted about 10 min per family. The second episode prompted the family to drive him to [facility] ED. On arrival to [facility] ED the pt reported to nurse first with the c/o slurred speech and facial droop. Code stroke was called. Pt taken for CT. CT showed no evidence of acute intracranial abnormality. ASPECTS is 10.Chronic infarcts and chronic small vessel ischemic disease. Pt taken back to ED. On assessment, the pt was without acute focal deficits. NIHSS 3 for residual CVA deficits. See EMR for code stroke times and NIHSS. Code Stroke canceled. tPA not given d/t stroke not suspected. 1g Keppra given in ED. Bedside handoff with ED RN Chrislyn

## 2016-09-16 NOTE — ED Provider Notes (Signed)
[facility] DEPT Provider Note   CSN: [id number] Arrival date & time: 09/16/16  1438   An emergency department physician performed an initial assessment on this suspected stroke patient at 1430.  History   Chief Complaint Chief Complaint  Patient presents with  . Code Stroke    HPI [name] is a 83 y.o. male hx of HL, seizures, previous stroke with L sided residual deficit, here with seizure vs syncopal.. Around 11:30 AM, patient's family noticed that he had some trouble speaking and has been drooling on the left side of his mouth. He states that he tried to speak but was unable to speak. Denies any tonic-clonic jerking movements. Have previous seizures with similar symptoms. Patient had 2 further episodes since then. He is on Keppra 500 mg twice a day and been taking it as prescribed.    The history is provided by the patient.    Past Medical History:  Diagnosis Date  . Abnormality of gait 09/15/2015  . Cancer ([place])   . Dyslipidemia   . Foot drop, left 09/15/2015  . Hemiparesis, aphasia, and dysphagia as late effects of stroke ([place]) 09/15/2015  . High cholesterol   . History of cerebrovascular disease    Right parietal, left cerebellar strokes, chronic  . Hypertension   . Prostate cancer ([place])    Radium seed implants  . Seizures ([place])    05/2013  . Stroke ([place])    04/20/1998  . TIA (transient ischemic attack)     Patient Active Problem List   Diagnosis Date Noted  . Abnormality of gait 09/15/2015  . Hemiparesis, aphasia, and dysphagia as late effects of stroke ([place]) 09/15/2015  . Foot drop, left 09/15/2015  . Seizure ([place]) 07/12/2013  . Stroke ([place])   . TIA (transient ischemic attack)   . Seizures ([place])   . Hypertension   . High cholesterol     Past Surgical History:  Procedure Laterality Date  . ABDOMINAL HERNIA REPAIR    . CAROTID ENDARTERECTOMY     Bilateral  . CATARACT EXTRACTION Bilateral   . Radium seed implants     Prostate cancer  .  TONSILLECTOMY         Home Medications    Prior to Admission medications   Medication Sig Start Date End Date Taking? Authorizing Provider  acetaminophen (TYLENOL) 325 MG tablet Take 325-650 mg by mouth every 6 (six) hours as needed for mild pain.   Yes Historical Provider, MD  aspirin EC 81 MG tablet Take 81 mg by mouth daily.   Yes Historical Provider, MD  baclofen (LIORESAL) 10 MG tablet Take 0.5 tablets (5 mg total) by mouth 3 (three) times daily. 09/15/15  Yes Kathrynn Ducking, MD  BIOTIN PO Take 1 tablet by mouth daily.   Yes Historical Provider, MD  Cholecalciferol (VITAMIN D PO) Take 1 tablet by mouth daily.   Yes Historical Provider, MD  clopidogrel (PLAVIX) 75 MG tablet Take 75 mg by mouth every evening.    Yes Historical Provider, MD  finasteride (PROSCAR) 5 MG tablet Take 5 mg by mouth daily.   Yes Historical Provider, MD  levETIRAcetam (KEPPRA) 500 MG tablet Take 1 tablet (500 mg total) by mouth 2 (two) times daily. 07/28/13  Yes Kathrynn Ducking, MD  losartan (COZAAR) 50 MG tablet Take 25 mg by mouth daily.   Yes Historical Provider, MD  Multiple Vitamin (MULTIVITAMIN WITH MINERALS) TABS tablet Take 1 tablet by mouth daily.   Yes Historical Provider, MD  Omega-3 Fatty Acids (OMEGA 3 PO) Take 1 capsule by mouth daily.   Yes Historical Provider, MD  pantoprazole (PROTONIX) 40 MG tablet Take 40 mg by mouth daily.   Yes Historical Provider, MD  pravastatin (PRAVACHOL) 20 MG tablet Take 20 mg by mouth at bedtime.   Yes Historical Provider, MD    Family History Family History  Problem Relation Age of Onset  . Heart failure Brother   . Heart disease Brother   . Heart failure Mother   . Alcoholism Father   . Diabetes Daughter   . Diabetes Brother   . Heart disease Brother   . Heart disease Brother   . Diabetes Sister     Social History Social History  Substance Use Topics  . Smoking status: Never Smoker  . Smokeless tobacco: Never Used  . Alcohol use No      Allergies   Ativan [lorazepam] and Tape   Review of Systems Review of Systems  Neurological: Positive for seizures.  All other systems reviewed and are negative.    Physical Exam Updated Vital Signs BP 127/57   Pulse 66   Temp 98.1 F (36.7 C) (Oral)   Resp 14   Ht 5\' 9"  (1.753 m)   Wt 171 lb 15.3 oz (78 kg)   SpO2 92%   BMI 25.39 kg/m   Physical Exam  Constitutional:  Chronically ill   HENT:  Head: Normocephalic.  Mouth/Throat: Oropharynx is clear and moist.  Eyes: EOM are normal. Pupils are equal, round, and reactive to light.  Neck: Normal range of motion. Neck supple.  Cardiovascular: Normal rate, regular rhythm and normal heart sounds.   Pulmonary/Chest: Effort normal and breath sounds normal. No respiratory distress. He has no wheezes.  Abdominal: Soft. Bowel sounds are normal.  Musculoskeletal: Normal range of motion.  Neurological:  Alert, Contracted L arm (chronic). Mild dysmetria bilaterally, worse on L side.   Skin: Skin is warm.  Psychiatric: He has a normal mood and affect.  Nursing note and vitals reviewed.    ED Treatments / Results  Labs (all labs ordered are listed, but only abnormal results are displayed) Labs Reviewed  APTT - Abnormal; Notable for the following:       Result Value   aPTT 44 (*)    All other components within normal limits  CBC - Abnormal; Notable for the following:    RBC 4.10 (*)    HCT 38.5 (*)    All other components within normal limits  COMPREHENSIVE METABOLIC PANEL - Abnormal; Notable for the following:    Sodium 130 (*)    Chloride 94 (*)    Glucose, Bld 104 (*)    GFR calc non Af Amer 56 (*)    All other components within normal limits  I-STAT CHEM 8, ED - Abnormal; Notable for the following:    Sodium 130 (*)    Chloride 92 (*)    BUN 25 (*)    Glucose, Bld 101 (*)    All other components within normal limits  PROTIME-INR  DIFFERENTIAL  I-STAT TROPOININ, ED  CBG MONITORING, ED    EKG  EKG  Interpretation  Date/Time:  Friday September 16 2016 15:01:20 EST Ventricular Rate:  67 PR Interval:    QRS Duration: 97 QT Interval:  391 QTC Calculation: 413 R Axis:   108 Text Interpretation:  Junctional rhythm Right axis deviation Minimal ST depression, inferior leads Artifact in lead(s) I III aVR aVL aVF V1 V2 V3 V5  V6 and baseline wander in lead(s) V2 poor baseline, grossly unchanged  Confirmed by Mattheo Swindle  MD, Millard Bautch ([id number]) on 09/16/2016 3:07:03 PM       Radiology Ct Head Code Stroke W/o Cm  Result Date: 09/16/2016 CLINICAL DATA:  Code stroke.  Slurred speech and facial droop. EXAM: CT HEAD WITHOUT CONTRAST TECHNIQUE: Contiguous axial images were obtained from the base of the skull through the vertex without intravenous contrast. COMPARISON:  09/27/2013 FINDINGS: Brain: A moderately large chronic infarct is again seen in the right frontal and parietal lobes with some involvement of the lateral right thalamus. Small chronic infarcts are again seen in the left frontal lobe, left parietal lobe, and bilateral cerebellum. The ventricles are unchanged in size. There is no evidence of acute cortical infarct, intracranial hemorrhage, mass, midline shift, or extra-axial fluid collection. Elsewhere, periventricular white matter T2 hyperintensities are similar to the prior study and compatible with moderate chronic small vessel ischemic disease. Vascular: Calcified atherosclerosis at the skullbase. Chronic calcified right MCA branch vessel embolus in the sylvian fissure. No hyperdense vessel. Skull: No fracture or focal osseous lesion. Sinuses/Orbits: The visualized paranasal sinuses are clear. There is a chronic sclerotic appearance of the right mastoid tip. Prior bilateral cataract extraction. Other: None. ASPECTS [facility] Stroke Program Early CT Score) - Ganglionic level infarction (caudate, lentiform nuclei, internal capsule, insula, M1-M3 cortex): 7 - Supraganglionic infarction (M4-M6 cortex): 3 Total score  (0-10 with 10 being normal): 10 IMPRESSION: 1. No evidence of acute intracranial abnormality. 2. ASPECTS is 10. 3. Chronic infarcts and chronic small vessel ischemic disease as above. These results were called by telephone at the time of interpretation on 09/16/2016 at 3:07 pm to Dr. Alver Fisher, who verbally acknowledged these results. Electronically Signed   By: Logan Bores M.D.   On: 09/16/2016 15:10    Procedures Procedures (including critical care time)  Medications Ordered in ED Medications  levETIRAcetam (KEPPRA) 1,000 mg in sodium chloride 0.9 % 100 mL IVPB (0 mg Intravenous Stopped 09/16/16 1529)  sodium chloride 0.9 % bolus 1,000 mL (1,000 mLs Intravenous New Bag/Given 09/16/16 1506)     Initial Impression / Assessment and Plan / ED Course  I have reviewed the triage vital signs and the nursing notes.  Pertinent labs & imaging results that were available during my care of the patient were reviewed by me and considered in my medical decision making (see chart for details).     [name] is a 83 y.o. male here with seizures vs syncope. Back to baseline now, had 4 episodes today. Code stroke activated in triage. Neuro saw patient and recommend loading with IV keppra and code stroke canceled by neuro. Will get labs, CT head, give IV keppra.   4:33 PM Labs showed Na 130, unclear if its from the seizure. CT head unremarkable. Given keppra. Family practice to admit (unassigned medicine, patient's PCP is in [place]).    Final Clinical Impressions(s) / ED Diagnoses   Final diagnoses:  Seizure [facility])    New Prescriptions New Prescriptions   No medications on file     Drenda Freeze, MD 09/16/16 [phone]

## 2016-09-16 NOTE — H&P (Signed)
[facility] Admission History and Physical Service Pager: [phone]  Patient name: [name] Medical record number: [id number] Date of birth: Apr 23, 1929 Age: 83 y.o. Gender: male  Primary Care Provider: Nicholos Johns, MD Consultants: neurology Code Status: full  Chief Complaint: AMS  Assessment and Plan: Della Szakacs is a 83 y.o. male presenting with episodes of AMS. PMH is significant for prior stroke in 1999 with residual left-sided weakness and seizure disorder diagnosed in 2014, HTN, and HLD.  Seizure- two episodes earlier today lasting roughly 30 minutes maximum. Had slurred speech and confusion at this time that resolved. Possibly some shaking movements and also some abnormal noises during episodes. Family concerned and brought him to ED for evaluation. In ED, neuro exam without focal findings except for residual left sided weakness from prior stroke. Head CT negative for acute findings. Neurology consulted in ED and thought episodes were consistent with seizure activity. Episodes would be less consistent with TIA but remains on differential. Sodium 130 on admission. Troponin 0.00. EKG showing NSR. Vitals WNL. No signs of infection. Follows with [facility]. -place in observation telemetry, attending Dr. McDiarmid -vitals per floor -neurology following, appreciate recommendations -s/p loading dose keppra in the ED -increase Keppra to 750mg  BID -am EKG -seizure precautions -PT/OT evaluation -up with assistance  History of stroke with residual left sided weakness- on daily aspirin and plavix. Hx of bilateral carotid endarctectomy. No acute deficits seen on neurologic exam, has decreased sensation and strength on left side which is normal for him after his stroke that occurred in 1999. -neurology following -risk stratification labs A1C, TSH, lipid panels -PT/OT eval -neuro checks q4 hours -continue aspirin and  plavix  Hypochloridemia and Hyponatremia- sodium noted to be 130 in ED and Cl is 92. Per patient, it was also low a year ago. He avoids salt in diet because he thought this was good for him. Possibly chronic, but some concern that this could cause AMS.  -monitor BMP - Regular diet, encouraged to a balanced diet   HLD- on pravachol 20 mg at home  -repeat lipid panel -continue home statin  HTN- on losartan 25 mg daily. BP 101/65-180/69 in ED. -continue home losartan  -monitor BP  FEN/GI: NPO until passes stroke swallow screen then regular diet Prophylaxis: SCD's  Disposition: pending clinical improvement, PT/OT evaluation  History of Present Illness:  [name] is a 83 y.o. male presenting with episodes of altered mental status and speech changes.  Earlier today, patient was out with his wife and sister-in-law. In the care patient had two episodes where he became drowsy and had confusion and slurred/garbled speech. During which he would be unable to answer where he was. He slowly would become less confused and the abnormal speech would resolve. The first episode lasted roughly 30 minutes. The second episode, later that afternoon, lasted roughly 15 minutes but included some abnormal movements and sounds. No tongue biting or loss of bladder or bowels. The family then brought him to the ED. At time of interview, patient states he feels well. He has normal speech. No confusion. He denies recent illnesses. States that he has had a few prior episodes like this over the past several months. He tried to make an appointment with his neurologist at [facility] but on the day of appointment the doctor was out sick with the flu. He rescheduled for end of March.    Review Of Systems: Per HPI with the following additions:  Review of Systems  Constitutional: Negative for chills and fever.  Eyes: Negative for blurred vision and double vision.  Respiratory: Negative for  cough, shortness of breath and wheezing.   Cardiovascular: Negative for chest pain, palpitations and leg swelling.  Gastrointestinal: Negative for abdominal pain, blood in stool, constipation, diarrhea, nausea and vomiting.  Genitourinary: Negative for dysuria.  Musculoskeletal: Positive for falls.  Neurological: Positive for speech change, focal weakness and seizures. Negative for tingling and headaches.  Psychiatric/Behavioral: Negative for substance abuse.    Patient Active Problem List   Diagnosis Date Noted  . Abnormality of gait 09/15/2015  . Hemiparesis, aphasia, and dysphagia as late effects of stroke ([place]) 09/15/2015  . Foot drop, left 09/15/2015  . Seizure ([place]) 07/12/2013  . Stroke ([place])   . TIA (transient ischemic attack)   . Seizures ([place])   . Hypertension   . High cholesterol     Past Medical History: Past Medical History:  Diagnosis Date  . Abnormality of gait 09/15/2015  . Cancer ([place])   . Dyslipidemia   . Foot drop, left 09/15/2015  . Hemiparesis, aphasia, and dysphagia as late effects of stroke ([place]) 09/15/2015  . High cholesterol   . History of cerebrovascular disease    Right parietal, left cerebellar strokes, chronic  . Hypertension   . Prostate cancer ([place])    Radium seed implants  . Seizures ([place])    05/2013  . Stroke ([place])    04/20/1998  . TIA (transient ischemic attack)     Past Surgical History: Past Surgical History:  Procedure Laterality Date  . ABDOMINAL HERNIA REPAIR    . CAROTID ENDARTERECTOMY     Bilateral  . CATARACT EXTRACTION Bilateral   . Radium seed implants     Prostate cancer  . TONSILLECTOMY      Social History: Social History  Substance Use Topics  . Smoking status: Never Smoker  . Smokeless tobacco: Never Used  . Alcohol use No   Additional social history: lives with wife, daughter and sister-in-law help with medical care Please also refer to relevant sections of EMR.  Family History: Family History  Problem  Relation Age of Onset  . Heart failure Brother   . Heart disease Brother   . Heart failure Mother   . Alcoholism Father   . Diabetes Daughter   . Diabetes Brother   . Heart disease Brother   . Heart disease Brother   . Diabetes Sister    Allergies and Medications: Allergies  Allergen Reactions  . Ativan [Lorazepam] Other (See Comments)    MOOD CHANGE; AGGRESSIVE  . Tape Other (See Comments)    SKIN IS VERY THIN AND WILL TEAR AND BRUISE EASILY!!   No current facility-administered medications on file prior to encounter.    Current Outpatient Prescriptions on File Prior to Encounter  Medication Sig Dispense Refill  . aspirin EC 81 MG tablet Take 81 mg by mouth daily.    . baclofen (LIORESAL) 10 MG tablet Take 0.5 tablets (5 mg total) by mouth 3 (three) times daily. 50 each 1  . BIOTIN PO Take 1 tablet by mouth daily.    . Cholecalciferol (VITAMIN D PO) Take 1 tablet by mouth daily.    . clopidogrel (PLAVIX) 75 MG tablet Take 75 mg by mouth every evening.     . finasteride (PROSCAR) 5 MG tablet Take 5 mg by mouth daily.    Marland Kitchen levETIRAcetam (KEPPRA) 500 MG tablet Take 1 tablet (500 mg total) by mouth 2 (two) times daily.  180 tablet 3  . losartan (COZAAR) 50 MG tablet Take 25 mg by mouth daily.    . Multiple Vitamin (MULTIVITAMIN WITH MINERALS) TABS tablet Take 1 tablet by mouth daily.    . Omega-3 Fatty Acids (OMEGA 3 PO) Take 1 capsule by mouth daily.    . pantoprazole (PROTONIX) 40 MG tablet Take 40 mg by mouth daily.    . pravastatin (PRAVACHOL) 20 MG tablet Take 20 mg by mouth at bedtime.      Objective: BP 180/69   Pulse 83   Temp 98.1 F (36.7 C)   Resp 25   Ht 5\' 9"  (1.753 m)   Wt 171 lb 15.3 oz (78 kg)   SpO2 96%   BMI 25.39 kg/m  Exam: General: elderly man laying in bed in NAD Eyes: EOMI, PERRLA. No scleral icterus or conjunctival pallor. ENTM: moist mucous membranes. No erythema or exudate in oropharynx. Neck: supple, nontender, no lymphadenopathy, no  thyromegaly Cardiovascular: RRR no MRG, +2 dorsalis and radial pulses Respiratory: CTA bilaterally. No increased work of breathing. Gastrointestinal: soft, non-tender, non-distended. +BS MSK: left arm chronically contracted. No edema or cyanosis Derm: skin warm, dry. Pale appearing. No rashes or lesions noted Neuro: CN2-12 in tact. Sensation decreased on left side compared to right. 5/5 strength in right extremities. 4/5 strength in left extremities.  Psych: appropriate mood and affect  Labs and Imaging: CBC BMET   Recent Labs Lab 09/16/16 1455 09/16/16 1506  WBC 7.5  --   HGB 13.4 13.9  HCT 38.5* 41.0  PLT 269  --     Recent Labs Lab 09/16/16 1455 09/16/16 1506  NA 130* 130*  K 4.6 4.8  CL 94* 92*  CO2 27  --   BUN 19 25*  CREATININE 1.16 1.10  GLUCOSE 104* 101*  CALCIUM 9.8  --      Ct Head Code Stroke W/o Cm  Result Date: 09/16/2016 CLINICAL DATA:  Code stroke.  Slurred speech and facial droop. EXAM: CT HEAD WITHOUT CONTRAST TECHNIQUE: Contiguous axial images were obtained from the base of the skull through the vertex without intravenous contrast. COMPARISON:  09/27/2013 FINDINGS: Brain: A moderately large chronic infarct is again seen in the right frontal and parietal lobes with some involvement of the lateral right thalamus. Small chronic infarcts are again seen in the left frontal lobe, left parietal lobe, and bilateral cerebellum. The ventricles are unchanged in size. There is no evidence of acute cortical infarct, intracranial hemorrhage, mass, midline shift, or extra-axial fluid collection. Elsewhere, periventricular white matter T2 hyperintensities are similar to the prior study and compatible with moderate chronic small vessel ischemic disease. Vascular: Calcified atherosclerosis at the skullbase. Chronic calcified right MCA branch vessel embolus in the sylvian fissure. No hyperdense vessel. Skull: No fracture or focal osseous lesion. Sinuses/Orbits: The visualized  paranasal sinuses are clear. There is a chronic sclerotic appearance of the right mastoid tip. Prior bilateral cataract extraction. Other: None. ASPECTS [facility] Stroke Program Early CT Score) - Ganglionic level infarction (caudate, lentiform nuclei, internal capsule, insula, M1-M3 cortex): 7 - Supraganglionic infarction (M4-M6 cortex): 3 Total score (0-10 with 10 being normal): 10 IMPRESSION: 1. No evidence of acute intracranial abnormality. 2. ASPECTS is 10. 3. Chronic infarcts and chronic small vessel ischemic disease as above. These results were called by telephone at the time of interpretation on 09/16/2016 at 3:07 pm to Dr. Alver Fisher, who verbally acknowledged these results. Electronically Signed   By: Logan Bores M.D.   On: 09/16/2016 15:10  Steve Rattler, DO 09/16/2016, 5:41 PM PGY-1, [place] Intern pager: [phone], text pages welcome  I have separately seen and examined the patient. I have discussed the findings and exam with Dr Vanetta Shawl and agree with the above note.  My changes/additions are outlined in BLUE.   Smitty Cords, MD [place], PGY-2

## 2016-09-17 DIAGNOSIS — E871 Hypo-osmolality and hyponatremia: Secondary | ICD-10-CM | POA: Diagnosis not present

## 2016-09-17 DIAGNOSIS — G40909 Epilepsy, unspecified, not intractable, without status epilepticus: Secondary | ICD-10-CM | POA: Diagnosis not present

## 2016-09-17 DIAGNOSIS — I10 Essential (primary) hypertension: Secondary | ICD-10-CM | POA: Diagnosis not present

## 2016-09-17 DIAGNOSIS — Z8673 Personal history of transient ischemic attack (TIA), and cerebral infarction without residual deficits: Secondary | ICD-10-CM

## 2016-09-17 DIAGNOSIS — I69354 Hemiplegia and hemiparesis following cerebral infarction affecting left non-dominant side: Secondary | ICD-10-CM | POA: Diagnosis not present

## 2016-09-17 DIAGNOSIS — R569 Unspecified convulsions: Secondary | ICD-10-CM | POA: Diagnosis not present

## 2016-09-17 LAB — LIPID PANEL
Cholesterol: 159 mg/dL (ref 0–200)
HDL: 43 mg/dL (ref >40)
LDL Cholesterol: 93 mg/dL (ref 0–99)
TRIGLYCERIDES: 115 mg/dL (ref <150)
Total CHOL/HDL Ratio: 3.7 RATIO
VLDL: 23 mg/dL (ref 0–40)

## 2016-09-17 LAB — CBC
HCT: 38 % — ABNORMAL LOW (ref 39.0–52.0)
Hemoglobin: 13 g/dL (ref 13.0–17.0)
MCH: 32.1 pg (ref 26.0–34.0)
MCHC: 34.2 g/dL (ref 30.0–36.0)
MCV: 93.8 fL (ref 78.0–100.0)
PLATELETS: 258 10*3/uL (ref 150–400)
RBC: 4.05 MIL/uL — AB (ref 4.22–5.81)
RDW: 12 % (ref 11.5–15.5)
WBC: 5.9 10*3/uL (ref 4.0–10.5)

## 2016-09-17 LAB — TSH: TSH: 4.08 u[IU]/mL (ref 0.350–4.500)

## 2016-09-17 LAB — BASIC METABOLIC PANEL
ANION GAP: 10 (ref 5–15)
BUN: 14 mg/dL (ref 6–20)
CO2: 23 mmol/L (ref 22–32)
Calcium: 9.2 mg/dL (ref 8.9–10.3)
Chloride: 98 mmol/L — ABNORMAL LOW (ref 101–111)
Creatinine, Ser: 0.97 mg/dL (ref 0.61–1.24)
GFR calc Af Amer: >60 mL/min (ref >60)
Glucose, Bld: 130 mg/dL — ABNORMAL HIGH (ref 65–99)
POTASSIUM: 4.2 mmol/L (ref 3.5–5.1)
SODIUM: 131 mmol/L — AB (ref 135–145)

## 2016-09-17 MED ORDER — LEVETIRACETAM 750 MG PO TABS: 750.0000 mg | ORAL_TABLET | Freq: Two times a day (BID) | ORAL | 2 refills | Status: DC

## 2016-09-17 NOTE — Progress Notes (Signed)
Pt was admitted from ED per stretcher accompanied by nurse tech, RN and pt daughter, on arrival pt fully alert and oriented, self introduced to pt and family, ID bracelet checked, fall prevention plan discussed with pt, tele hooked on ccmd notified, pt fed admission hx not done due to busy schdule will pass it on to the first shift nurse to get it done when ever possible treatment started will continue to monitor

## 2016-09-17 NOTE — Progress Notes (Signed)
Nsg Discharge Note  Admit Date:  09/16/2016 Discharge date: 09/17/2016   [name] to be D/C'd Home per MD order.  AVS completed.  Copy for chart, and copy for patient signed, and dated. Patient/caregiver able to verbalize understanding.  Discharge Medication: Allergies as of 09/17/2016      Reactions   Ativan [lorazepam] Other (See Comments)   MOOD CHANGE; AGGRESSIVE   Tape Other (See Comments)   SKIN IS VERY THIN AND WILL TEAR AND BRUISE EASILY!!      Medication List    TAKE these medications   acetaminophen 325 MG tablet Commonly known as:  TYLENOL Take 325-650 mg by mouth every 6 (six) hours as needed for mild pain.   aspirin EC 81 MG tablet Take 81 mg by mouth daily.   baclofen 10 MG tablet Commonly known as:  LIORESAL Take 0.5 tablets (5 mg total) by mouth 3 (three) times daily.   BIOTIN PO Take 1 tablet by mouth daily.   clopidogrel 75 MG tablet Commonly known as:  PLAVIX Take 75 mg by mouth every evening.   finasteride 5 MG tablet Commonly known as:  PROSCAR Take 5 mg by mouth daily.   levETIRAcetam 750 MG tablet Commonly known as:  KEPPRA Take 1 tablet (750 mg total) by mouth 2 (two) times daily. What changed:  medication strength  how much to take   losartan 50 MG tablet Commonly known as:  COZAAR Take 25 mg by mouth daily.   multivitamin with minerals Tabs tablet Take 1 tablet by mouth daily.   OMEGA 3 PO Take 1 capsule by mouth daily.   pantoprazole 40 MG tablet Commonly known as:  PROTONIX Take 40 mg by mouth daily.   pravastatin 20 MG tablet Commonly known as:  PRAVACHOL Take 20 mg by mouth at bedtime.   VITAMIN D PO Take 1 tablet by mouth daily.       Discharge Assessment: Vitals:   09/16/16 2033 09/17/16 0528  BP: (!) 165/71 (!) 138/54  Pulse: 73 61  Resp: 18 20  Temp: 98.3 F (36.8 C) 97.7 F (36.5 C)   Skin clean, dry and intact without evidence of skin break down, no evidence of skin tears noted. IV catheter  discontinued intact. Site without signs and symptoms of complications - no redness or edema noted at insertion site, patient denies c/o pain - only slight tenderness at site.  Dressing with slight pressure applied.  D/c Instructions-Education: Discharge instructions given to patient/family with verbalized understanding. D/c education completed with patient/family including follow up instructions, medication list, d/c activities limitations if indicated, with other d/c instructions as indicated by MD - patient able to verbalize understanding, all questions fully answered. Patient instructed to return to ED, call 911, or call MD for any changes in condition.  Patient escorted via [place], and D/C home via private auto.  Salley Slaughter, RN 09/17/2016 4:28 PM

## 2016-09-17 NOTE — Evaluation (Signed)
Physical Therapy Evaluation & Discharge Patient Details Name: [name] MRN: [id number] DOB: 08-12-1928 Today's Date: 09/17/2016   History of Present Illness  [name] is a 83 y.o. male presenting with episodes of AMS. PMH is significant for prior stroke in 1999 with residual left-sided weakness and seizure disorder diagnosed in 2014, HTN, and HLD.  Clinical Impression  Patient presents close to functional baseline for mobility.  Will have assist at home with family and has all needed equipment.  No current or follow up skilled needs.  Will sign off.     Follow Up Recommendations No PT follow up    Equipment Recommendations  None recommended by PT    Recommendations for Other Services       Precautions / Restrictions Precautions Precautions: Fall Precaution Comments: recent fall about a week ago out of getting out of bed feels foot caught on bed linen      Mobility  Bed Mobility Overal bed mobility: Needs Assistance Bed Mobility: Supine to Sit     Supine to sit: Min assist;HOB elevated     General bed mobility comments: for scooting out L side  Transfers Overall transfer level: Needs assistance Equipment used: Straight cane Transfers: Sit to/from Stand Sit to Stand: Min assist         General transfer comment: up from EOB pt pulls up on rail, assist on L per his direction for balance  Ambulation/Gait Ambulation/Gait assistance: Min guard Ambulation Distance (Feet): 150 Feet Assistive device: Straight cane Gait Pattern/deviations: Step-through pattern;Decreased stride length;Wide base of support     General Gait Details: keeps cane wide, L hits with toes first in equinovarus positioning, knee pops into recurvatum in midstance  Stairs            Wheelchair Mobility    Modified Rankin (Stroke Patients Only)       Balance                                             Pertinent Vitals/Pain Pain Assessment:  No/denies pain    Home Living Family/patient expects to be discharged to:: Private residence Living Arrangements: Spouse/significant other;Other relatives Available Help at Discharge: Family;Available 24 hours/day Type of Home: House Home Access: Level entry     Home Layout: Two level Home Equipment: Shower seat;Grab bars - tub/shower;Hand held shower head;Walker - 4 wheels;Cane - single point;Wheelchair - manual      Prior Function Level of Independence: Needs assistance   Gait / Transfers Assistance Needed: cane and occasional assist for ambulation  ADL's / Homemaking Assistance Needed: assist for bathing and dressing         Hand Dominance   Dominant Hand: Right    Extremity/Trunk Assessment   Upper Extremity Assessment Upper Extremity Assessment: LUE deficits/detail LUE Deficits / Details: elbow flexion contracture, able to extend fingers, but keeps tight fist usually, limited shoulder elevation     Lower Extremity Assessment Lower Extremity Assessment: LLE deficits/detail LLE Deficits / Details: AROM grossly WFL, moves some with synergy in flexion, strength grossly 3+ to 4-/5 (functional deficits with equinovarus positioning of foot and hyperextension of the knee in IC))       Communication   Communication: No difficulties  Cognition Arousal/Alertness: Awake/alert Behavior During Therapy: WFL for tasks assessed/performed Overall Cognitive Status: Within Functional Limits for tasks assessed  General Comments      Exercises     Assessment/Plan    PT Assessment Patent does not need any further PT services  PT Problem List            PT Treatment Interventions      PT Goals (Current goals can be found in the Care Plan section)  Acute Rehab PT Goals PT Goal Formulation: All assessment and education complete, DC therapy    Frequency     Barriers to discharge        Co-evaluation               End of Session  Equipment Utilized During Treatment: Gait belt Activity Tolerance: Patient tolerated treatment well Patient left: in chair;with call bell/phone within reach;with family/visitor present      Functional Assessment Tool Used: Clinical Judgement Functional Limitation: Mobility: Walking and moving around Mobility: Walking and Moving Around Current Status [id number]): At least 20 percent but less than 40 percent impaired, limited or restricted Mobility: Walking and Moving Around Goal Status [phone]): At least 20 percent but less than 40 percent impaired, limited or restricted Mobility: Walking and Moving Around Discharge Status [phone]): At least 20 percent but less than 40 percent impaired, limited or restricted    Time: 0951-1014 PT Time Calculation (min) (ACUTE ONLY): 23 min   Charges:   PT Evaluation $PT Eval Moderate Complexity: 1 Procedure PT Treatments $Gait Training: 8-22 mins   PT G Codes:   PT G-Codes **NOT FOR INPATIENT CLASS** Functional Assessment Tool Used: Clinical Judgement Functional Limitation: Mobility: Walking and moving around Mobility: Walking and Moving Around Current Status [id number]): At least 20 percent but less than 40 percent impaired, limited or restricted Mobility: Walking and Moving Around Goal Status [phone]): At least 20 percent but less than 40 percent impaired, limited or restricted Mobility: Walking and Moving Around Discharge Status [phone]): At least 20 percent but less than 40 percent impaired, limited or restricted    Reginia Naas 09/17/2016, 10:40 AM  Magda Kiel, PT [phone] 09/17/2016

## 2016-09-17 NOTE — Discharge Instructions (Signed)
Seizure, Adult °When you have a seizure: °· Parts of your body may move. °· How aware or awake (conscious) you are may change. °· You may shake (convulse). ° °Some people have symptoms right before a seizure happens. These symptoms may include: °· Fear. °· Worry (anxiety). °· Feeling like you are going to throw up (nausea). °· Feeling like the room is spinning (vertigo). °· Feeling like you saw or heard something before (deja vu). °· Odd tastes or smells. °· Changes in vision, such as seeing flashing lights or spots. ° °Seizures usually last from 30 seconds to 2 minutes. Usually, they are not harmful unless they last a long time. °Follow these instructions at home: °Medicines °· Take over-the-counter and prescription medicines only as told by your doctor. °· Avoid anything that may keep your medicine from working, such as alcohol. °Activity °· Do not do any activities that would be dangerous if you had another seizure, like driving or swimming. Wait until your doctor approves. °· If you live in the [place] ask your local DMV (department of motor vehicles) when you can drive. °· Rest. °Teaching others °· Teach friends and family what to do when you have a seizure. They should: °? Lay you on the ground. °? Protect your head and body. °? Loosen any tight clothing around your neck. °? Turn you on your side. °? Stay with you until you are better. °? Not hold you down. °? Not put anything in your mouth. °? Know whether or not you need emergency care. °General instructions °· Contact your doctor each time you have a seizure. °· Avoid anything that gives you seizures. °· Keep a seizure diary. Write down: °? What you think caused each seizure. °? What you remember about each seizure. °· Keep all follow-up visits as told by your doctor. This is important. °Contact a doctor if: °· You have another seizure. °· You have seizures more often. °· There is any change in what happens during your seizures. °· You continue to have  seizures with treatment. °· You have symptoms of being sick or having an infection. °Get help right away if: °· You have a seizure: °? That lasts longer than 5 minutes. °? That is different than seizures you had before. °? That makes it harder to breathe. °? After you hurt your head. °· After a seizure, you cannot speak or use a part of your body. °· After a seizure, you are confused or have a bad headache. °· You have two or more seizures in a row. °· You are having seizures more often. °· You do not wake up right after a seizure. °· You get hurt during a seizure. °In an emergency: °· These symptoms may be an emergency. Do not wait to see if the symptoms will go away. Get medical help right away. Call your local emergency services (911 in the [place] Do not drive yourself to the hospital. °This information is not intended to replace advice given to you by your health care provider. Make sure you discuss any questions you have with your health care provider. °Document Released: 01/11/2008 Document Revised: 04/06/2016 Document Reviewed: 04/06/2016 °Elsevier Interactive Patient Education © 2017 [employer] ° °

## 2016-09-17 NOTE — Evaluation (Addendum)
Occupational Therapy Evaluation and Discharge Patient Details Name: [name] MRN: [id number] DOB: [birth year] Today's Date: 09/17/2016    History of Present Illness [name] is a 83 y.o. male presenting with episodes of AMS. PMH is significant for prior stroke in 1999 with residual left-sided weakness and seizure disorder diagnosed in 2014, HTN, and HLD.   Clinical Impression   Pt reports he required assist from family with ADL PTA. Currently pt min assist for ADL and min guard for functional mobility with use of cane. Feel pt is at or close to his functional baseline; pt agrees. Pt planning to d/c home with 24/7 supervision from family. No further acute OT needs identified; signing off at this time. Please re-consult if needs change. Thank you for this referral.    Follow Up Recommendations  No OT follow up;Supervision/Assistance - 24 hour    Equipment Recommendations  None recommended by OT    Recommendations for Other Services       Precautions / Restrictions Precautions Precautions: Fall Precaution Comments: recent fall about a week ago out of getting out of bed feels foot caught on bed linen Restrictions Weight Bearing Restrictions: No      Mobility Bed Mobility               General bed mobility comments: Pt OOB in chair upon arrival  Transfers Overall transfer level: Needs assistance Equipment used: Straight cane Transfers: Sit to/from Stand Sit to Stand: Min assist         General transfer comment: to boost up from chair    Balance Overall balance assessment: Needs assistance Sitting-balance support: Feet supported;No upper extremity supported Sitting balance-Leahy Scale: Good     Standing balance support: Single extremity supported Standing balance-Leahy Scale: Fair                              ADL Overall ADL's : Needs assistance/impaired Eating/Feeding: Independent;Sitting   Grooming: Min guard;Standing    Upper Body Bathing: Minimal assistance;Sitting   Lower Body Bathing: Minimal assistance;Sit to/from stand   Upper Body Dressing : Minimal assistance;Sitting   Lower Body Dressing: Minimal assistance;Sit to/from stand   Toilet Transfer: Minimal assistance;Ambulation (cane) Toilet Transfer Details (indicate cue type and reason): min assist to boost up from chair, min guard for functional mobility         Functional mobility during ADLs: Min guard;Cane General ADL Comments: Pt reports he feels he has returned to baseline     Vision     Perception     Praxis      Pertinent Vitals/Pain Pain Assessment: No/denies pain     Hand Dominance Right   Extremity/Trunk Assessment Upper Extremity Assessment Upper Extremity Assessment: LUE deficits/detail LUE Deficits / Details: elbow flexion contracture, able to extend fingers, but keeps tight fist usually, limited shoulder elevation  LUE Sensation: decreased light touch   Lower Extremity Assessment Lower Extremity Assessment: Defer to PT evaluation   Cervical / Trunk Assessment Cervical / Trunk Assessment: Kyphotic   Communication Communication Communication: No difficulties   Cognition Arousal/Alertness: Awake/alert Behavior During Therapy: WFL for tasks assessed/performed Overall Cognitive Status: Within Functional Limits for tasks assessed                     General Comments       Exercises       Shoulder Instructions      Home Living Family/patient expects to  be discharged to:: Private residence Living Arrangements: Spouse/significant other;Other relatives Available Help at Discharge: Family;Available 24 hours/day Type of Home: House Home Access: Level entry     Home Layout: Two level Alternate Level Stairs-Number of Steps: one step up to bedrooms Alternate Level Stairs-Rails: None Bathroom Shower/Tub: Occupational psychologist: Handicapped height     Home Equipment: Shower seat;Grab  bars - tub/shower;Hand held Tourist information centre manager - 4 wheels;Cane - single point;Wheelchair - manual;Bedside commode          Prior Functioning/Environment Level of Independence: Needs assistance  Gait / Transfers Assistance Needed: cane and occasional assist for ambulation ADL's / Homemaking Assistance Needed: assist for bathing and dressing, has only been sponge bathing for past ~6 weeks            OT Problem List:     OT Treatment/Interventions:      OT Goals(Current goals can be found in the care plan section) Acute Rehab OT Goals Patient Stated Goal: return home OT Goal Formulation: All assessment and education complete, DC therapy  OT Frequency:     Barriers to D/C:            Co-evaluation              End of Session Equipment Utilized During Treatment: Gait belt;Other (comment) (cane)  Activity Tolerance: Patient tolerated treatment well Patient left: in chair;with call bell/phone within reach;with family/visitor present   Time: [id number] OT Time Calculation (min): 11 min Charges:  OT General Charges $OT Visit: 1 Procedure OT Evaluation $OT Eval Moderate Complexity: 1 Procedure G-Codes: OT G-codes **NOT FOR INPATIENT CLASS** Functional Assessment Tool Used: Clinical judgement Functional Limitation: Self care Self Care Current Status [id number]): At least 1 percent but less than 20 percent impaired, limited or restricted Self Care Goal Status [id number]): At least 1 percent but less than 20 percent impaired, limited or restricted Self Care Discharge Status [phone]): At least 1 percent but less than 20 percent impaired, limited or restricted   Binnie Kand M.S., OTR/L Pager: [phone]  09/17/2016, 3:39 PM

## 2016-09-17 NOTE — Progress Notes (Signed)
Family Medicine Teaching Service Daily Progress Note Intern Pager: [phone]  Patient name: [name] Medical record number: [id number] Date of birth: 05-Jan-1929 Age: 83 y.o. Gender: male  Primary Care Provider: Nicholos Johns, MD Consultants: neurology  Code Status: Full   Pt Overview and Major Events to Date:  2/9: Admit to FPTS, loaded with Keppra   Assessment and Plan: [name] is a 83 y.o. male presenting with episodes of AMS. PMH is significant for prior stroke in 1999 with residual left-sided weakness and seizure disorder diagnosed in 2014, HTN, and HLD.  Seizure: No seizure-like episodes overnight. Neurological exam unchanged from yesterday with residual left sided weakness from prior stroke. s/p loading dose keppra in the ED. EKG this morning showing no acute changes from yesterday. -vitals per floor -neurology following, appreciate recommendations -Continue Keppra to 750mg  BID -seizure precautions -PT/OT evaluation -up with assistance  History of stroke with residual left sided weakness- on daily aspirin and plavix. Hx of bilateral carotid endarctectomy. No acute deficits seen on neurologic exam, has decreased sensation and strength on left side which is normal for him after his stroke that occurred in 1999. -neurology following -risk stratification labs A1C, TSH, lipid panels -PT/OT eval -neuro checks q4 hours -continue aspirin and plavix  Hypochloridemia and Hyponatremia- sodium noted to be 130 in ED and Cl is 92. Per patient, it was also low a year ago. He avoids salt in diet because he thought this was good for him. Possibly chronic, but some concern that this could cause AMS. May also consider SIADH (can possibly be caused from seizure medications) or hypothyroidism. -monitor BMP - Regular diet, encouraged to a balanced diet   HLD- on pravachol 20 mg at home  -repeat lipid panel -continue home statin  HTN-  normotensive this morning  -continue  home losartan  -monitor BP  FEN/GI: NPO until passes stroke swallow screen then regular diet Prophylaxis: SCD's  Disposition: Pending neurology, PT, OT recommendations, and improvement of electrolytes  Subjective:  Vital signs stable and patient afebrile overnight. No acute events overnight. Patient notes that he slept well and he had a good breakfast this morning. He has been urinating appropriately. Has not had a bowel movement yet. Denies any chest pain, shortness of breath, nausea, vomiting, diarrhea, constipation, dysuria, seizures. His daughter stayed with him throughout the night and did not notice any seizure-like activity.  Objective: Temp:  [97.7 F (36.5 C)-98.3 F (36.8 C)] 97.7 F (36.5 C) (02/10 0528) Pulse Rate:  [60-83] 61 (02/10 0528) Resp:  [13-27] 20 (02/10 0528) BP: (101-180)/(54-94) 138/54 (02/10 0528) SpO2:  [92 %-99 %] 93 % (02/10 0528) Weight:  [171 lb 15.3 oz (78 kg)] 171 lb 15.3 oz (78 kg) (02/09 1507) Physical Exam: General: Elderly gentleman sitting up in bed eating breakfast, in no acute distress  Cardiovascular: Regular rate and rhythm, normal S1-S2, no edema  Respiratory: Normal work of breathing, clear to auscultation bilaterally  Abdomen: Soft, nondistended, nontender, normal bowel sounds  Neurologic Exam  Mental Status: alert; oriented to person, place and year; knowledge is normal for age; language is normal Cranial Nerves:extraocular movements are full and conjugate; pupils are round reactive to light; symmetric facial strength; midline tongue and uvula Motor: Normal strength, tone and mass on right side, 4-5 strength on left upper and lower extremity; good fine motor movements; no pronator drift. Left hand and arm retracted. Sensory: Decreased sensation on left side compared to right Gait: Not observed   Laboratory:  Recent Labs Lab 09/16/16 1455  09/16/16 1506  WBC 7.5  --   HGB 13.4 13.9  HCT 38.5* 41.0  PLT 269  --     Recent  Labs Lab 09/16/16 1455 09/16/16 1506  NA 130* 130*  K 4.6 4.8  CL 94* 92*  CO2 27  --   BUN 19 25*  CREATININE 1.16 1.10  CALCIUM 9.8  --   PROT 7.2  --   BILITOT 0.8  --   ALKPHOS 67  --   ALT 22  --   AST 26  --   GLUCOSE 104* 101*    Imaging/Diagnostic Tests: Ct Head Code Stroke W/o Cm  Result Date: 09/16/2016 CLINICAL DATA:  Code stroke.  Slurred speech and facial droop. EXAM: CT HEAD WITHOUT CONTRAST TECHNIQUE: Contiguous axial images were obtained from the base of the skull through the vertex without intravenous contrast. COMPARISON:  09/27/2013 FINDINGS: Brain: A moderately large chronic infarct is again seen in the right frontal and parietal lobes with some involvement of the lateral right thalamus. Small chronic infarcts are again seen in the left frontal lobe, left parietal lobe, and bilateral cerebellum. The ventricles are unchanged in size. There is no evidence of acute cortical infarct, intracranial hemorrhage, mass, midline shift, or extra-axial fluid collection. Elsewhere, periventricular white matter T2 hyperintensities are similar to the prior study and compatible with moderate chronic small vessel ischemic disease. Vascular: Calcified atherosclerosis at the skullbase. Chronic calcified right MCA branch vessel embolus in the sylvian fissure. No hyperdense vessel. Skull: No fracture or focal osseous lesion. Sinuses/Orbits: The visualized paranasal sinuses are clear. There is a chronic sclerotic appearance of the right mastoid tip. Prior bilateral cataract extraction. Other: None. ASPECTS [facility] Stroke Program Early CT Score) - Ganglionic level infarction (caudate, lentiform nuclei, internal capsule, insula, M1-M3 cortex): 7 - Supraganglionic infarction (M4-M6 cortex): 3 Total score (0-10 with 10 being normal): 10 IMPRESSION: 1. No evidence of acute intracranial abnormality. 2. ASPECTS is 10. 3. Chronic infarcts and chronic small vessel ischemic disease as above. These results  were called by telephone at the time of interpretation on 09/16/2016 at 3:07 pm to Dr. Alver Fisher, who verbally acknowledged these results. Electronically Signed   By: Logan Bores M.D.   On: 09/16/2016 15:10     Carlyle Dolly, MD 09/17/2016, 7:03 AM PGY-2, [place] Intern pager: [phone], text pages welcome

## 2016-09-17 NOTE — Progress Notes (Signed)
Subjective: Interval History: none.. No acute events overnight.  Objective: Vital signs in last 24 hours: Temp:  [97.7 F (36.5 C)-98.3 F (36.8 C)] 97.7 F (36.5 C) (02/10 0528) Pulse Rate:  [60-83] 61 (02/10 0528) Resp:  [13-27] 20 (02/10 0528) BP: (101-180)/(54-94) 138/54 (02/10 0528) SpO2:  [92 %-99 %] 93 % (02/10 0528) Weight:  [78 kg (171 lb 15.3 oz)] 78 kg (171 lb 15.3 oz) (02/09 1507)  Intake/Output from previous day: 02/09 0701 - 02/10 0700 In: 1840 [P.O.:480; I.V.:1250; IV Piggyback:110] Out: 1275 [Urine:1275] Intake/Output this shift: Total I/O In: -  Out: 400 [Urine:400] Nutritional status: Diet Heart Room service appropriate? Yes; Fluid consistency: Thin  HEENT-  Normocephalic, no lesions, Neck supple and no rigidity was appreciated.  Neurologic Examination: Mental status: Awake alert and oriented to all spheres. Speech and language: No evidence of dysarthria was appreciated. Comprehension intact. Naming intact. Fluent.  Cranial nerves: Pupils approximately 2 mm and reactive to light. Extra muscles intact. Facial sensation symmetric. Very mild left nasolabial fold flattening however improves with smile. Hearing intact. Uvula midline. Tongue midline. Motor: 5/5 in right side except RUE shoulder frozen. LUE 3/5 with flexion contracture. LLE 5/5 Sensory: Decreased sensation on left side Coordination: Normal finger to nose and heel to shin on right  Gait: Deferred  Lab Results:  Recent Labs  09/16/16 1455 09/16/16 1506  WBC 7.5  --   HGB 13.4 13.9  HCT 38.5* 41.0  PLT 269  --   NA 130* 130*  K 4.6 4.8  CL 94* 92*  CO2 27  --   GLUCOSE 104* 101*  BUN 19 25*  CREATININE 1.16 1.10  CALCIUM 9.8  --    Lipid Panel No results for input(s): CHOL, TRIG, HDL, CHOLHDL, VLDL, LDLCALC in the last 72 hours.  Studies/Results: Ct Head Code Stroke W/o Cm  Result Date: 09/16/2016 CLINICAL DATA:  Code stroke.  Slurred speech and facial droop. EXAM: CT HEAD WITHOUT  CONTRAST TECHNIQUE: Contiguous axial images were obtained from the base of the skull through the vertex without intravenous contrast. COMPARISON:  09/27/2013 FINDINGS: Brain: A moderately large chronic infarct is again seen in the right frontal and parietal lobes with some involvement of the lateral right thalamus. Small chronic infarcts are again seen in the left frontal lobe, left parietal lobe, and bilateral cerebellum. The ventricles are unchanged in size. There is no evidence of acute cortical infarct, intracranial hemorrhage, mass, midline shift, or extra-axial fluid collection. Elsewhere, periventricular white matter T2 hyperintensities are similar to the prior study and compatible with moderate chronic small vessel ischemic disease. Vascular: Calcified atherosclerosis at the skullbase. Chronic calcified right MCA branch vessel embolus in the sylvian fissure. No hyperdense vessel. Skull: No fracture or focal osseous lesion. Sinuses/Orbits: The visualized paranasal sinuses are clear. There is a chronic sclerotic appearance of the right mastoid tip. Prior bilateral cataract extraction. Other: None. ASPECTS [facility] Stroke Program Early CT Score) - Ganglionic level infarction (caudate, lentiform nuclei, internal capsule, insula, M1-M3 cortex): 7 - Supraganglionic infarction (M4-M6 cortex): 3 Total score (0-10 with 10 being normal): 10 IMPRESSION: 1. No evidence of acute intracranial abnormality. 2. ASPECTS is 10. 3. Chronic infarcts and chronic small vessel ischemic disease as above. These results were called by telephone at the time of interpretation on 09/16/2016 at 3:07 pm to Dr. Alver Fisher, who verbally acknowledged these results. Electronically Signed   By: Logan Bores M.D.   On: 09/16/2016 15:10    Medications:  Scheduled: . aspirin EC  81 mg Oral Daily  . baclofen  5 mg Oral TID  . clopidogrel  75 mg Oral QPM  . finasteride  5 mg Oral Daily  . levETIRAcetam  750 mg Oral BID  . losartan  25 mg Oral  Daily  . pantoprazole  40 mg Oral Daily  . pravastatin  20 mg Oral QHS   Continuous:  [id number] **OR** acetaminophen, polyethylene glycol  Assessment/Plan: 83 years old male with complex partial seizure with possible secondary generalization in the setting of old stroke. Per daughter he is completely back to his baseline.  -Cont with Keppra 750mg  BID. Denied any side effects. -Follow up with [facility].  Seizure precautions including driving restriction per [place] discussed with patient.  Strict return instructions provided if this happens again or new neurological symptoms appear.  No further recommendations from Neurology at this time. Please call with questions.   LOS: 0 days   Rochele Raring

## 2016-09-18 LAB — HEMOGLOBIN A1C
HEMOGLOBIN A1C: 5.7 % — AB (ref 4.8–5.6)
MEAN PLASMA GLUCOSE: 117 mg/dL

## 2016-09-20 DIAGNOSIS — Z09 Encounter for follow-up examination after completed treatment for conditions other than malignant neoplasm: Secondary | ICD-10-CM | POA: Diagnosis not present

## 2016-09-20 DIAGNOSIS — Z79899 Other long term (current) drug therapy: Secondary | ICD-10-CM | POA: Diagnosis not present

## 2016-09-20 DIAGNOSIS — G40909 Epilepsy, unspecified, not intractable, without status epilepticus: Secondary | ICD-10-CM | POA: Diagnosis not present

## 2016-09-20 DIAGNOSIS — Z6826 Body mass index (BMI) 26.0-26.9, adult: Secondary | ICD-10-CM | POA: Diagnosis not present

## 2016-09-20 NOTE — Discharge Summary (Signed)
[facility] Discharge Summary  Patient name: [name] Medical record number: [id number] Date of birth: 1929-03-11 Age: 83 y.o. Gender: male Date of Admission: 09/16/2016  Date of Discharge: 09/17/16 Admitting Physician: Blane Ohara McDiarmid, MD  Primary Care Provider: Nicholos Johns, MD Consultants: Neurology, PT, OT  Indication for Hospitalization: AMS  Discharge Diagnoses/Problem List:  Patient Active Problem List   Diagnosis Date Noted  . History of CVA (cerebrovascular accident)   . Abnormality of gait 09/15/2015  . Hemiparesis, aphasia, and dysphagia as late effects of stroke ([place]) 09/15/2015  . Foot drop, left 09/15/2015  . Seizure ([place]) 07/12/2013  . Stroke ([place])   . TIA (transient ischemic attack)   . Seizures ([place])   . Hypertension   . High cholesterol      Disposition: home  Discharge Condition: stable  Discharge Exam: see progress note from day of discharge  Brief Hospital Course:   [name] is an 83 year old male with PMH of stroke with residual left sided weakness, seizure disorder, HLD, and HTN who who presented to [facility] ED after episodes of confusion. Code stroke was called with normal head imaging. Patient was at his baseline mentation in the ED. Per Neurology, episodes of confusion were more consistent with seizures and he was given a loading dose of Keppra in the ED. His home dose of Keppra was increased. His other medical problems were stable during his hospitalization. He was seen by PT who did not recommend further follow up. He was discharged home on 09/17/16 with recommendation to follow with PCP closely.  Issues for Follow Up:  1. Recommend driving restriction per [place] law after seizure 2. Follow up with neurology as scheduled 3. Recommend following electrolytes, patient was hyponatremic and hypochloremic on admission, which appears to be chronic   Significant Procedures: none  Significant Labs and  Imaging:   Recent Labs Lab 09/16/16 1455 09/16/16 1506 09/17/16 1052  WBC 7.5  --  5.9  HGB 13.4 13.9 13.0  HCT 38.5* 41.0 38.0*  PLT 269  --  258    Recent Labs Lab 09/16/16 1455 09/16/16 1506 09/17/16 1052  NA 130* 130* 131*  K 4.6 4.8 4.2  CL 94* 92* 98*  CO2 27  --  23  GLUCOSE 104* 101* 130*  BUN 19 25* 14  CREATININE 1.16 1.10 0.97  CALCIUM 9.8  --  9.2  ALKPHOS 67  --   --   AST 26  --   --   ALT 22  --   --   ALBUMIN 4.1  --   --    Ct Head Code Stroke W/o Cm  Result Date: 09/16/2016 CLINICAL DATA:  Code stroke.  Slurred speech and facial droop. EXAM: CT HEAD WITHOUT CONTRAST TECHNIQUE: Contiguous axial images were obtained from the base of the skull through the vertex without intravenous contrast. COMPARISON:  09/27/2013 FINDINGS: Brain: A moderately large chronic infarct is again seen in the right frontal and parietal lobes with some involvement of the lateral right thalamus. Small chronic infarcts are again seen in the left frontal lobe, left parietal lobe, and bilateral cerebellum. The ventricles are unchanged in size. There is no evidence of acute cortical infarct, intracranial hemorrhage, mass, midline shift, or extra-axial fluid collection. Elsewhere, periventricular white matter T2 hyperintensities are similar to the prior study and compatible with moderate chronic small vessel ischemic disease. Vascular: Calcified atherosclerosis at the skullbase. Chronic calcified right MCA branch vessel embolus in the sylvian  fissure. No hyperdense vessel. Skull: No fracture or focal osseous lesion. Sinuses/Orbits: The visualized paranasal sinuses are clear. There is a chronic sclerotic appearance of the right mastoid tip. Prior bilateral cataract extraction. Other: None. ASPECTS [facility] Stroke Program Early CT Score) - Ganglionic level infarction (caudate, lentiform nuclei, internal capsule, insula, M1-M3 cortex): 7 - Supraganglionic infarction (M4-M6 cortex): 3 Total score (0-10  with 10 being normal): 10 IMPRESSION: 1. No evidence of acute intracranial abnormality. 2. ASPECTS is 10. 3. Chronic infarcts and chronic small vessel ischemic disease as above. These results were called by telephone at the time of interpretation on 09/16/2016 at 3:07 pm to Dr. Alver Fisher, who verbally acknowledged these results. Electronically Signed   By: Logan Bores M.D.   On: 09/16/2016 15:10    Results/Tests Pending at Time of Discharge: none  Discharge Medications:  Allergies as of 09/17/2016      Reactions   Ativan [lorazepam] Other (See Comments)   MOOD CHANGE; AGGRESSIVE   Tape Other (See Comments)   SKIN IS VERY THIN AND WILL TEAR AND BRUISE EASILY!!      Medication List    TAKE these medications   acetaminophen 325 MG tablet Commonly known as:  TYLENOL Take 325-650 mg by mouth every 6 (six) hours as needed for mild pain.   aspirin EC 81 MG tablet Take 81 mg by mouth daily.   baclofen 10 MG tablet Commonly known as:  LIORESAL Take 0.5 tablets (5 mg total) by mouth 3 (three) times daily.   BIOTIN PO Take 1 tablet by mouth daily.   clopidogrel 75 MG tablet Commonly known as:  PLAVIX Take 75 mg by mouth every evening.   finasteride 5 MG tablet Commonly known as:  PROSCAR Take 5 mg by mouth daily.   levETIRAcetam 750 MG tablet Commonly known as:  KEPPRA Take 1 tablet (750 mg total) by mouth 2 (two) times daily. What changed:  medication strength  how much to take   losartan 50 MG tablet Commonly known as:  COZAAR Take 25 mg by mouth daily.   multivitamin with minerals Tabs tablet Take 1 tablet by mouth daily.   OMEGA 3 PO Take 1 capsule by mouth daily.   pantoprazole 40 MG tablet Commonly known as:  PROTONIX Take 40 mg by mouth daily.   pravastatin 20 MG tablet Commonly known as:  PRAVACHOL Take 20 mg by mouth at bedtime.   VITAMIN D PO Take 1 tablet by mouth daily.       Discharge Instructions: Please refer to Patient Instructions section of  EMR for full details.  Patient was counseled important signs and symptoms that should prompt return to medical care, changes in medications, dietary instructions, activity restrictions, and follow up appointments.   Follow-Up Appointments: Follow-up Information    UPPIN,NINA, MD. Schedule an appointment as soon as possible for a visit.   Specialty:  Internal Medicine Contact information: [address] [place] [address] [place], DO 09/20/2016, 9:30 PM PGY-1, [place]

## 2016-11-02 ENCOUNTER — Encounter: Payer: Self-pay | Admitting: Adult Health

## 2016-11-02 ENCOUNTER — Ambulatory Visit (INDEPENDENT_AMBULATORY_CARE_PROVIDER_SITE_OTHER): Payer: Medicare Other | Admitting: Adult Health

## 2016-11-02 VITALS — BP 163/73 | HR 62 | Ht 70.0 in | Wt 169.5 lb

## 2016-11-02 DIAGNOSIS — R569 Unspecified convulsions: Secondary | ICD-10-CM | POA: Diagnosis not present

## 2016-11-02 DIAGNOSIS — Z8673 Personal history of transient ischemic attack (TIA), and cerebral infarction without residual deficits: Secondary | ICD-10-CM

## 2016-11-02 MED ORDER — LEVETIRACETAM 750 MG PO TABS: 750.0000 mg | ORAL_TABLET | Freq: Two times a day (BID) | ORAL | 2 refills | Status: DC

## 2016-11-02 MED ORDER — LEVETIRACETAM 500 MG PO TABS: 1000.0000 mg | ORAL_TABLET | Freq: Two times a day (BID) | ORAL | 5 refills | Status: DC

## 2016-11-02 NOTE — Progress Notes (Signed)
I have read the note, and I agree with the clinical assessment and plan.  WILLIS,CHARLES KEITH   

## 2016-11-02 NOTE — Progress Notes (Addendum)
PATIENT: [name] DOB: [birth year]  REASON FOR VISIT: follow up- history of stroke, seizures HISTORY FROM: patient  HISTORY OF PRESENT ILLNESS: [name] is an 83 year old male with a history of stroke and seizures. He returns today for follow-up. He reports that he had a seizure event in February. He states that these events occur when he is sitting in the car. He states that they go to the store once a week and he will set in the car while his wife is in shopping. He states that the sun is shining in the car and he will doze off to sleep. He states his wife will knock on the window and when he wakes up he is disoriented, often he is unable to speak. He states that this typically lasts about 2-3 minutes. He reports that these events do not occur any other time then when he is in the car waiting on his wife. In February when he was taken to the emergency room his Keppra was increased to 750 mg twice a day. He reports poor despite the increase he has continued to have these events in the car. He returns today for an evaluation.   REVIEW OF SYSTEMS: Out of a complete 14 system review of symptoms, the patient complains only of the following symptoms, and all other reviewed systems are negative.  See history of present illness  ALLERGIES: Allergies  Allergen Reactions  . Ativan [Lorazepam] Other (See Comments)    MOOD CHANGE; AGGRESSIVE  . Tape Other (See Comments)    SKIN IS VERY THIN AND WILL TEAR AND BRUISE EASILY!!    HOME MEDICATIONS: Outpatient Medications Prior to Visit  Medication Sig Dispense Refill  . acetaminophen (TYLENOL) 325 MG tablet Take 325-650 mg by mouth every 6 (six) hours as needed for mild pain.    Marland Kitchen aspirin EC 81 MG tablet Take 81 mg by mouth daily.    . baclofen (LIORESAL) 10 MG tablet Take 0.5 tablets (5 mg total) by mouth 3 (three) times daily. 50 each 1  . BIOTIN PO Take 1 tablet by mouth daily.    . Cholecalciferol (VITAMIN D PO) Take 1 tablet  by mouth daily.    . clopidogrel (PLAVIX) 75 MG tablet Take 75 mg by mouth every evening.     . finasteride (PROSCAR) 5 MG tablet Take 5 mg by mouth daily.    Marland Kitchen levETIRAcetam (KEPPRA) 750 MG tablet Take 1 tablet (750 mg total) by mouth 2 (two) times daily. 45 tablet 2  . losartan (COZAAR) 50 MG tablet Take 25 mg by mouth daily.    . Multiple Vitamin (MULTIVITAMIN WITH MINERALS) TABS tablet Take 1 tablet by mouth daily.    . Omega-3 Fatty Acids (OMEGA 3 PO) Take 1 capsule by mouth daily.    . pantoprazole (PROTONIX) 40 MG tablet Take 40 mg by mouth daily.    . pravastatin (PRAVACHOL) 20 MG tablet Take 20 mg by mouth at bedtime.     No facility-administered medications prior to visit.     PAST MEDICAL HISTORY: Past Medical History:  Diagnosis Date  . Abnormality of gait 09/15/2015  . Cancer ([place])   . Dyslipidemia   . Foot drop, left 09/15/2015  . Hemiparesis, aphasia, and dysphagia as late effects of stroke ([place]) 09/15/2015  . High cholesterol   . History of cerebrovascular disease    Right parietal, left cerebellar strokes, chronic  . Hypertension   . Prostate cancer ([place])    Radium seed  implants  . Seizures ([place])    05/2013  . Stroke ([place])    04/20/1998  . TIA (transient ischemic attack)     PAST SURGICAL HISTORY: Past Surgical History:  Procedure Laterality Date  . ABDOMINAL HERNIA REPAIR    . CAROTID ENDARTERECTOMY     Bilateral  . CATARACT EXTRACTION Bilateral   . Radium seed implants     Prostate cancer  . TONSILLECTOMY      FAMILY HISTORY: Family History  Problem Relation Age of Onset  . Heart failure Brother   . Heart disease Brother   . Heart failure Mother   . Alcoholism Father   . Diabetes Daughter   . Diabetes Brother   . Heart disease Brother   . Heart disease Brother   . Diabetes Sister     SOCIAL HISTORY: Social History   Social History  . Marital status: Married    Spouse name: N/A  . Number of children: 2    . Years of education: college 2     Occupational History  . Retired    Social History Main Topics  . Smoking status: Never Smoker  . Smokeless tobacco: Never Used  . Alcohol use No  . Drug use: No  . Sexual activity: Not on file   Other Topics Concern  . Not on file   Social History Narrative   Married, 2 children 1 deceased   Right handed   2 yr college   occ tea      PHYSICAL EXAM  Vitals:   11/02/16 1348  BP: (!) 163/73  Pulse: 62  Weight: 169 lb 8 oz (76.9 kg)  Height: 5\' 10"  (1.778 m)   Body mass index is 24.32 kg/m.  Generalized: Well developed, in no acute distress   Neurological examination  Mentation: Alert oriented to time, place, history taking. Follows all commands speech and language fluent Cranial nerve II-XII: Pupils were equal round reactive to light. Extraocular movements were full, visual field were full on confrontational test. Facial sensation and strength were normal. Uvula tongue midline. Head turning and shoulder shrug  were normal and symmetric. Motor: The motor testing reveals 5 over 5 strength In the right upper and lower extremity. Increased tone on the left. 4/5 strength in the left upper and lower extremity. Left foot drop. Sensory: Sensory testing is intact to soft touch on all 4 extremities. No evidence of extinction is noted.  Coordination: Cerebellar testing reveals good finger-nose-finger and heel-to-shin bilaterally Gait and station: The patient has a circumduction type gait on the left. Tandem gait not attempted. He uses a cane when ambulating. Reflexes: Deep tendon reflexes are symmetric and normal bilaterally.   DIAGNOSTIC DATA (LABS, IMAGING, TESTING) - I reviewed patient records, labs, notes, testing and imaging myself where available.  Lab Results  Component Value Date   WBC 5.9 09/17/2016   HGB 13.0 09/17/2016   HCT 38.0 (L) 09/17/2016   MCV 93.8 09/17/2016   PLT 258 09/17/2016      Component Value Date/Time   NA 131 (L) 09/17/2016 1052   K 4.2  09/17/2016 1052   CL 98 (L) 09/17/2016 1052   CO2 23 09/17/2016 1052   GLUCOSE 130 (H) 09/17/2016 1052   BUN 14 09/17/2016 1052   CREATININE 0.97 09/17/2016 1052   CALCIUM 9.2 09/17/2016 1052   PROT 7.2 09/16/2016 1455   ALBUMIN 4.1 09/16/2016 1455   AST 26 09/16/2016 1455   ALT 22 09/16/2016 1455   ALKPHOS 67 09/16/2016  1455   BILITOT 0.8 09/16/2016 1455   GFRNONAA >60 09/17/2016 1052   GFRAA >60 09/17/2016 1052   Lab Results  Component Value Date   CHOL 159 09/17/2016   HDL 43 09/17/2016   LDLCALC 93 09/17/2016   TRIG 115 09/17/2016   CHOLHDL 3.7 09/17/2016   Lab Results  Component Value Date   HGBA1C 5.7 (H) 09/17/2016   No results found for: VITAMINB12 Lab Results  Component Value Date   TSH 4.080 09/17/2016      ASSESSMENT AND PLAN 83 y.o. year old male  has a past medical history of Abnormality of gait (09/15/2015); Cancer ([place]); Dyslipidemia; Foot drop, left (09/15/2015); Hemiparesis, aphasia, and dysphagia as late effects of stroke ([place]) (09/15/2015); High cholesterol; History of cerebrovascular disease; Hypertension; Prostate cancer ([place]); Seizures ([place]); Stroke [facility]); and TIA (transient ischemic attack). here with:  1. Seizures 2. History of CVA  I consulted with Dr. Jannifer Franklin- We do not feel that these events represent seizures but perhaps a disturbance in his sleep. At this time he will continue on Keppra 750 mg twice a day. If these events began to occur more frequently he should let us know. Encouraged patient to wear his AFO brace. He will follow-up in 6 months or sooner if needed.   Ward Givens, MSN, NP-C 11/02/2016, 1:43 PM [facility] [address], [place], [place] [address] [phone]

## 2016-11-02 NOTE — Patient Instructions (Addendum)
Continue Keppra 750 mg twice a day If these events become more frequent or at different times please let us know

## 2016-11-07 DIAGNOSIS — D3132 Benign neoplasm of left choroid: Secondary | ICD-10-CM | POA: Diagnosis not present

## 2016-11-10 DIAGNOSIS — S299XXA Unspecified injury of thorax, initial encounter: Secondary | ICD-10-CM | POA: Diagnosis not present

## 2016-11-10 DIAGNOSIS — E785 Hyperlipidemia, unspecified: Secondary | ICD-10-CM | POA: Diagnosis not present

## 2016-11-10 DIAGNOSIS — G40409 Other generalized epilepsy and epileptic syndromes, not intractable, without status epilepticus: Secondary | ICD-10-CM | POA: Diagnosis not present

## 2016-11-10 DIAGNOSIS — S06309A Unspecified focal traumatic brain injury with loss of consciousness of unspecified duration, initial encounter: Secondary | ICD-10-CM | POA: Diagnosis not present

## 2016-11-10 DIAGNOSIS — Z8673 Personal history of transient ischemic attack (TIA), and cerebral infarction without residual deficits: Secondary | ICD-10-CM | POA: Diagnosis not present

## 2016-11-10 DIAGNOSIS — I10 Essential (primary) hypertension: Secondary | ICD-10-CM | POA: Diagnosis not present

## 2016-11-10 DIAGNOSIS — S06351A Traumatic hemorrhage of left cerebrum with loss of consciousness of 30 minutes or less, initial encounter: Secondary | ICD-10-CM | POA: Diagnosis not present

## 2016-11-10 DIAGNOSIS — R569 Unspecified convulsions: Secondary | ICD-10-CM | POA: Diagnosis not present

## 2016-11-10 DIAGNOSIS — D72829 Elevated white blood cell count, unspecified: Secondary | ICD-10-CM | POA: Diagnosis not present

## 2016-11-10 DIAGNOSIS — S0990XA Unspecified injury of head, initial encounter: Secondary | ICD-10-CM | POA: Diagnosis not present

## 2016-11-10 DIAGNOSIS — S06350A Traumatic hemorrhage of left cerebrum without loss of consciousness, initial encounter: Secondary | ICD-10-CM | POA: Diagnosis not present

## 2016-11-10 DIAGNOSIS — W19XXXA Unspecified fall, initial encounter: Secondary | ICD-10-CM | POA: Diagnosis not present

## 2016-11-10 DIAGNOSIS — S06360A Traumatic hemorrhage of cerebrum, unspecified, without loss of consciousness, initial encounter: Secondary | ICD-10-CM | POA: Diagnosis not present

## 2016-11-10 DIAGNOSIS — R55 Syncope and collapse: Secondary | ICD-10-CM | POA: Diagnosis not present

## 2016-11-10 DIAGNOSIS — S01512A Laceration without foreign body of oral cavity, initial encounter: Secondary | ICD-10-CM | POA: Diagnosis not present

## 2016-11-10 DIAGNOSIS — M799 Soft tissue disorder, unspecified: Secondary | ICD-10-CM | POA: Diagnosis not present

## 2016-11-10 DIAGNOSIS — R404 Transient alteration of awareness: Secondary | ICD-10-CM | POA: Diagnosis not present

## 2016-11-10 DIAGNOSIS — I619 Nontraumatic intracerebral hemorrhage, unspecified: Secondary | ICD-10-CM | POA: Diagnosis not present

## 2016-11-11 DIAGNOSIS — R569 Unspecified convulsions: Secondary | ICD-10-CM | POA: Diagnosis not present

## 2016-11-11 DIAGNOSIS — S06309A Unspecified focal traumatic brain injury with loss of consciousness of unspecified duration, initial encounter: Secondary | ICD-10-CM | POA: Diagnosis not present

## 2016-11-11 DIAGNOSIS — W19XXXA Unspecified fall, initial encounter: Secondary | ICD-10-CM | POA: Diagnosis not present

## 2016-11-11 DIAGNOSIS — I10 Essential (primary) hypertension: Secondary | ICD-10-CM | POA: Diagnosis not present

## 2016-11-11 DIAGNOSIS — S01512A Laceration without foreign body of oral cavity, initial encounter: Secondary | ICD-10-CM | POA: Diagnosis not present

## 2016-11-11 DIAGNOSIS — S06351A Traumatic hemorrhage of left cerebrum with loss of consciousness of 30 minutes or less, initial encounter: Secondary | ICD-10-CM | POA: Diagnosis not present

## 2016-11-12 DIAGNOSIS — S06350A Traumatic hemorrhage of left cerebrum without loss of consciousness, initial encounter: Secondary | ICD-10-CM | POA: Diagnosis not present

## 2016-11-12 DIAGNOSIS — I10 Essential (primary) hypertension: Secondary | ICD-10-CM | POA: Diagnosis not present

## 2016-11-12 DIAGNOSIS — S01512A Laceration without foreign body of oral cavity, initial encounter: Secondary | ICD-10-CM | POA: Diagnosis not present

## 2016-11-12 DIAGNOSIS — W19XXXA Unspecified fall, initial encounter: Secondary | ICD-10-CM | POA: Diagnosis not present

## 2016-11-12 DIAGNOSIS — S06309A Unspecified focal traumatic brain injury with loss of consciousness of unspecified duration, initial encounter: Secondary | ICD-10-CM | POA: Diagnosis not present

## 2016-11-12 DIAGNOSIS — R569 Unspecified convulsions: Secondary | ICD-10-CM | POA: Diagnosis not present

## 2016-11-15 ENCOUNTER — Telehealth: Payer: Self-pay | Admitting: Neurology

## 2016-11-15 DIAGNOSIS — Z9181 History of falling: Secondary | ICD-10-CM | POA: Diagnosis not present

## 2016-11-15 DIAGNOSIS — I10 Essential (primary) hypertension: Secondary | ICD-10-CM | POA: Diagnosis not present

## 2016-11-15 DIAGNOSIS — I69354 Hemiplegia and hemiparesis following cerebral infarction affecting left non-dominant side: Secondary | ICD-10-CM | POA: Diagnosis not present

## 2016-11-15 DIAGNOSIS — R296 Repeated falls: Secondary | ICD-10-CM | POA: Diagnosis not present

## 2016-11-15 DIAGNOSIS — G40909 Epilepsy, unspecified, not intractable, without status epilepticus: Secondary | ICD-10-CM | POA: Diagnosis not present

## 2016-11-15 DIAGNOSIS — W19XXXD Unspecified fall, subsequent encounter: Secondary | ICD-10-CM | POA: Diagnosis not present

## 2016-11-15 NOTE — Telephone Encounter (Signed)
Called and spoke with Brittney from [facility]. She stated [name] left at 5pm. Scheduled appt for 11/30/16 at 730am, check in 700am. She will let [name] know and they will contact pt to let them know about appt.

## 2016-11-15 NOTE — Telephone Encounter (Signed)
   OK with work in on 4/25.

## 2016-11-15 NOTE — Telephone Encounter (Signed)
Myra with [facility] is calling to schedule the patient for a hospital follow up with Dr. Jannifer Franklin. He was discharged on 11-11-16 from having a fall. There is a 7:30 opening on 11-30-16 and Myra wants to know if patient can be scheduled. Please call and advise or let me know and I will contact Myra. She will fax records.

## 2016-11-22 DIAGNOSIS — I10 Essential (primary) hypertension: Secondary | ICD-10-CM | POA: Diagnosis not present

## 2016-11-22 DIAGNOSIS — Z79899 Other long term (current) drug therapy: Secondary | ICD-10-CM | POA: Diagnosis not present

## 2016-11-22 DIAGNOSIS — Z09 Encounter for follow-up examination after completed treatment for conditions other than malignant neoplasm: Secondary | ICD-10-CM | POA: Diagnosis not present

## 2016-11-22 DIAGNOSIS — Z6826 Body mass index (BMI) 26.0-26.9, adult: Secondary | ICD-10-CM | POA: Diagnosis not present

## 2016-11-25 DIAGNOSIS — R51 Headache: Secondary | ICD-10-CM | POA: Diagnosis not present

## 2016-11-25 DIAGNOSIS — G4489 Other headache syndrome: Secondary | ICD-10-CM | POA: Diagnosis not present

## 2016-11-25 DIAGNOSIS — S0990XA Unspecified injury of head, initial encounter: Secondary | ICD-10-CM | POA: Diagnosis not present

## 2016-11-30 ENCOUNTER — Ambulatory Visit: Payer: Self-pay | Admitting: Neurology

## 2017-01-18 ENCOUNTER — Ambulatory Visit (INDEPENDENT_AMBULATORY_CARE_PROVIDER_SITE_OTHER): Payer: Medicare Other | Admitting: Neurology

## 2017-01-18 ENCOUNTER — Encounter: Payer: Self-pay | Admitting: Neurology

## 2017-01-18 VITALS — BP 150/77 | HR 59 | Ht 70.0 in | Wt 163.0 lb

## 2017-01-18 DIAGNOSIS — R569 Unspecified convulsions: Secondary | ICD-10-CM | POA: Diagnosis not present

## 2017-01-18 DIAGNOSIS — I611 Nontraumatic intracerebral hemorrhage in hemisphere, cortical: Secondary | ICD-10-CM

## 2017-01-18 MED ORDER — LEVETIRACETAM 1000 MG PO TABS: 1000.0000 mg | ORAL_TABLET | Freq: Two times a day (BID) | ORAL | 3 refills | Status: DC

## 2017-01-18 NOTE — Progress Notes (Signed)
Reason for visit: Seizures  [name] is a 83 y.o. male  History of present illness:  [name] is an 83 year old right-handed white male with a history of a prior stroke event with a left hemiparesis. The patient has a history of seizures that is treated with Keppra. The patient had been in the hospital in early February 2018 after he had some confusion after he woke up from a nap and had some slurring of speech. He was admitted overnight. The Keppra dose was increased slightly to 750 mg twice daily. The patient was admitted to the hospital again around 11/11/2016 after a fall at home associated with head trauma, the patient had apparently blacked out prior to the fall. The fall was not witnessed. The patient was not noted to have any generalized jerking. In the hospital at [facility] he had an event that they were questioning was a seizure, the Keppra was increased to 1000 mg twice daily. The patient has had another fall since being out of the hospital on 11/25/2016, he also hit his head but he did not lose consciousness. The patient has been undergoing some physical therapy, he has not had any other recent falls. He returns to the office today for an evaluation.   Past Medical History:  Diagnosis Date  . Abnormality of gait 09/15/2015  . Cancer ([place])   . Dyslipidemia   . Foot drop, left 09/15/2015  . Hemiparesis, aphasia, and dysphagia as late effects of stroke ([place]) 09/15/2015  . High cholesterol   . History of cerebrovascular disease    Right parietal, left cerebellar strokes, chronic  . Hypertension   . Prostate cancer ([place])    Radium seed implants  . Seizures ([place])    05/2013  . Stroke ([place])    04/20/1998  . TIA (transient ischemic attack)     Past Surgical History:  Procedure Laterality Date  . ABDOMINAL HERNIA REPAIR    . CAROTID ENDARTERECTOMY     Bilateral  . CATARACT EXTRACTION Bilateral   . Radium seed implants     Prostate cancer  . TONSILLECTOMY       Family History  Problem Relation Age of Onset  . Heart failure Brother   . Heart disease Brother   . Heart failure Mother   . Alcoholism Father   . Diabetes Daughter   . Diabetes Brother   . Heart disease Brother   . Heart disease Brother   . Diabetes Sister     Social history:  reports that he has never smoked. He has never used smokeless tobacco. He reports that he does not drink alcohol or use drugs.  Medications:  Prior to Admission medications   Medication Sig Start Date End Date Taking? Authorizing Provider  aspirin EC 81 MG tablet Take 81 mg by mouth daily.   Yes [provider]  baclofen (LIORESAL) 10 MG tablet Take 0.5 tablets (5 mg total) by mouth 3 (three) times daily. 09/15/15  Yes Micheal Ducking, MD  BIOTIN PO Take 1 tablet by mouth daily.   Yes [provider]  Cholecalciferol (VITAMIN D PO) Take 1 tablet by mouth daily.   Yes [provider]  clopidogrel (PLAVIX) 75 MG tablet Take 75 mg by mouth every evening.    Yes [provider]  finasteride (PROSCAR) 5 MG tablet Take 5 mg by mouth daily.   Yes [provider]  losartan (COZAAR) 50 MG tablet Take 25 mg by mouth daily.   Yes  [provider]  Multiple Vitamin (MULTIVITAMIN WITH MINERALS) TABS tablet Take 1 tablet by mouth daily.   Yes [provider]  Omega-3 Fatty Acids (OMEGA 3 PO) Take 1 capsule by mouth daily.   Yes [provider]  pantoprazole (PROTONIX) 40 MG tablet Take 40 mg by mouth daily.   Yes [provider]  pravastatin (PRAVACHOL) 20 MG tablet Take 20 mg by mouth at bedtime.   Yes [provider]  levETIRAcetam (KEPPRA) 1000 MG tablet Take 1 tablet (1,000 mg total) by mouth 2 (two) times daily. 01/18/17   Micheal Ducking, MD      Allergies  Allergen Reactions  . Ativan [Lorazepam] Other (See Comments)    MOOD CHANGE; AGGRESSIVE  . Tape Other (See Comments)    SKIN IS VERY THIN AND WILL TEAR AND BRUISE  EASILY!!    ROS:  Out of a complete 14 system review of symptoms, the patient complains only of the following symptoms, and all other reviewed systems are negative.  Seizures  Weakness    Blood pressure (!) 150/77, pulse (!) 59, height 5\' 10"  (1.778 m), weight 163 lb (73.9 kg).  Physical Exam  General: The patient is alert and cooperative at the time of the examination.  Eyes: Pupils are equal, round, and reactive to light. Discs are flat bilaterally.  Neck: The neck is supple, no carotid bruits are noted.  Respiratory: The respiratory examination is clear.  Cardiovascular: The cardiovascular examination reveals a regular rate and rhythm, no obvious murmurs or rubs are noted.  Skin: Extremities are without significant edema.  Neurologic Exam  Mental status: The patient is alert and oriented x 3 at the time of the examination. The patient has apparent normal recent and remote memory, with an apparently normal attention span and concentration ability.  Cranial nerves: Facial symmetry is present. There is good sensation of the face to pinprick and soft touch bilaterally. The strength of the facial muscles and the muscles to head turning and shoulder shrug are normal bilaterally. Speech is well enunciated, no aphasia or dysarthria is noted. Extraocular movements are full. Visual fields are full. The tongue is midline, and the patient has symmetric elevation of the soft palate. No obvious hearing deficits are noted.  Motor: The motor testing reveals 5 over 5 strength of the right  extremities.  On the left side, there is cortical thumbing with the left hand, the patient is unable to extend the fingers on the left. The patient has restriction of elevation of the arms on both sides, mostly on the left. The patient has fair strength with flexion and extension on the left elbow. The patient has near-normal strength with the left leg.  Sensory: Sensory testing is intact to pinprick on the  face and arms, decreased on the left leg. Vibration sensation is decreased on the left foot as compared the right, fairly symmetric in arms.  Coordination: Cerebellar testing reveals good finger-nose-finger and heel-to-shin bilaterally.  Gait and station: Gait is wide-based and unsteady, the patient requires assistance with standing. Patient has a prominent circumduction gait with the left leg.  Reflexes: Deep tendon reflexes are symmetric and normal bilaterally.   Assessment/Plan:  1. History of stroke with left hemiparesis   2. Gait disorder   3. History of seizures   4. Recent fall with left frontal hemorrhage   The patient is felt to have had a traumatic hemorrhage in the left frontal lobe following a fall. The patient may have had  a seizure that induced the fall, the Keppra has been increased to 1000 mg twice daily. A prescription was called in for this medication. The patient is doing physical therapy at home, we will follow-up on his next scheduled revisit in October 2018. We previously had prescribed an AFO brace for the left foot, the patient felt that the brace was too expensive and did not get this prosthesis.  Jill Alexanders MD 01/18/2017 3:49 PM  [facility] [address] [place], [place] [address]  Phone [phone] Fax [phone]

## 2017-02-13 DIAGNOSIS — N3289 Other specified disorders of bladder: Secondary | ICD-10-CM | POA: Diagnosis not present

## 2017-02-13 DIAGNOSIS — C61 Malignant neoplasm of prostate: Secondary | ICD-10-CM | POA: Diagnosis not present

## 2017-04-06 DIAGNOSIS — Z8679 Personal history of other diseases of the circulatory system: Secondary | ICD-10-CM | POA: Diagnosis not present

## 2017-04-06 DIAGNOSIS — Z8673 Personal history of transient ischemic attack (TIA), and cerebral infarction without residual deficits: Secondary | ICD-10-CM | POA: Diagnosis not present

## 2017-04-06 DIAGNOSIS — R930 Abnormal findings on diagnostic imaging of skull and head, not elsewhere classified: Secondary | ICD-10-CM | POA: Diagnosis not present

## 2017-04-06 DIAGNOSIS — I10 Essential (primary) hypertension: Secondary | ICD-10-CM | POA: Diagnosis not present

## 2017-04-06 DIAGNOSIS — Z9889 Other specified postprocedural states: Secondary | ICD-10-CM | POA: Diagnosis not present

## 2017-04-06 DIAGNOSIS — Z09 Encounter for follow-up examination after completed treatment for conditions other than malignant neoplasm: Secondary | ICD-10-CM | POA: Diagnosis not present

## 2017-05-04 DIAGNOSIS — M1990 Unspecified osteoarthritis, unspecified site: Secondary | ICD-10-CM | POA: Diagnosis not present

## 2017-05-04 DIAGNOSIS — E559 Vitamin D deficiency, unspecified: Secondary | ICD-10-CM | POA: Diagnosis not present

## 2017-05-04 DIAGNOSIS — I10 Essential (primary) hypertension: Secondary | ICD-10-CM | POA: Diagnosis not present

## 2017-05-04 DIAGNOSIS — E785 Hyperlipidemia, unspecified: Secondary | ICD-10-CM | POA: Diagnosis not present

## 2017-05-04 DIAGNOSIS — Z79899 Other long term (current) drug therapy: Secondary | ICD-10-CM | POA: Diagnosis not present

## 2017-05-17 ENCOUNTER — Ambulatory Visit: Payer: Medicare Other | Admitting: Neurology

## 2017-05-19 DIAGNOSIS — S0990XA Unspecified injury of head, initial encounter: Secondary | ICD-10-CM | POA: Diagnosis not present

## 2017-05-19 DIAGNOSIS — M79602 Pain in left arm: Secondary | ICD-10-CM | POA: Diagnosis not present

## 2017-05-19 DIAGNOSIS — S0083XA Contusion of other part of head, initial encounter: Secondary | ICD-10-CM | POA: Diagnosis not present

## 2017-09-13 DIAGNOSIS — I10 Essential (primary) hypertension: Secondary | ICD-10-CM | POA: Diagnosis not present

## 2017-09-13 DIAGNOSIS — Z6826 Body mass index (BMI) 26.0-26.9, adult: Secondary | ICD-10-CM | POA: Diagnosis not present

## 2017-09-13 DIAGNOSIS — Z9181 History of falling: Secondary | ICD-10-CM | POA: Diagnosis not present

## 2017-09-13 DIAGNOSIS — E663 Overweight: Secondary | ICD-10-CM | POA: Diagnosis not present

## 2017-12-11 DIAGNOSIS — E663 Overweight: Secondary | ICD-10-CM | POA: Diagnosis not present

## 2017-12-11 DIAGNOSIS — I10 Essential (primary) hypertension: Secondary | ICD-10-CM | POA: Diagnosis not present

## 2017-12-11 DIAGNOSIS — Z6825 Body mass index (BMI) 25.0-25.9, adult: Secondary | ICD-10-CM | POA: Diagnosis not present

## 2017-12-11 DIAGNOSIS — E785 Hyperlipidemia, unspecified: Secondary | ICD-10-CM | POA: Diagnosis not present

## 2018-04-23 DIAGNOSIS — E559 Vitamin D deficiency, unspecified: Secondary | ICD-10-CM | POA: Diagnosis not present

## 2018-04-23 DIAGNOSIS — Z79899 Other long term (current) drug therapy: Secondary | ICD-10-CM | POA: Diagnosis not present

## 2018-04-23 DIAGNOSIS — I10 Essential (primary) hypertension: Secondary | ICD-10-CM | POA: Diagnosis not present

## 2018-04-23 DIAGNOSIS — E785 Hyperlipidemia, unspecified: Secondary | ICD-10-CM | POA: Diagnosis not present

## 2018-04-23 DIAGNOSIS — E663 Overweight: Secondary | ICD-10-CM | POA: Diagnosis not present

## 2018-04-23 DIAGNOSIS — Z6825 Body mass index (BMI) 25.0-25.9, adult: Secondary | ICD-10-CM | POA: Diagnosis not present

## 2018-04-24 ENCOUNTER — Other Ambulatory Visit: Payer: Self-pay | Admitting: Neurology

## 2018-04-24 ENCOUNTER — Telehealth: Payer: Self-pay | Admitting: Neurology

## 2018-04-24 MED ORDER — LEVETIRACETAM 1000 MG PO TABS
1000.0000 mg | ORAL_TABLET | Freq: Two times a day (BID) | ORAL | 1 refills | Status: DC
Start: 2018-04-24 — End: 2020-06-19

## 2018-04-24 NOTE — Telephone Encounter (Signed)
I called the patient.  The patient indicates that he had blood work done through the [facility] hospital, the [place] level was 55, this apparently is above the therapeutic range which is 45 further lab.  The patient otherwise feels fine, he is not having any issues on the medication, I indicated that I would treat the patient, not the blood levels of the medication.  The patient will remain on Keppra 1000 mg twice daily.

## 2018-04-24 NOTE — Telephone Encounter (Signed)
I called pt. Scheduled f/u for 06/06/18 at 12pm with Dr. Jannifer Franklin. Pt verbalized understanding and appreciation. He is aware Dr. Jannifer Franklin called in refill for him.

## 2018-04-24 NOTE — Telephone Encounter (Signed)
[name] with [facility] in [place] requesting refill of levETIRAcetam (KEPPRA) 1000 MG tablet. Patient usually uses the [place] but he is completely out and has not received medication from the [place].

## 2018-04-24 NOTE — Addendum Note (Signed)
Addended by: Kathrynn Ducking on: 04/24/2018 01:13 PM   Modules accepted: Orders

## 2018-04-24 NOTE — Telephone Encounter (Signed)
I will call in a prescription for the Keppra, we need to get a revisit set up.

## 2018-04-24 NOTE — Telephone Encounter (Signed)
Pt requesting a call stating that 1,000MG  is too much for him, would like to discuss a lower dosing

## 2018-06-01 DIAGNOSIS — Z23 Encounter for immunization: Secondary | ICD-10-CM | POA: Diagnosis not present

## 2018-06-03 ENCOUNTER — Encounter (HOSPITAL_COMMUNITY): Payer: Self-pay | Admitting: Emergency Medicine

## 2018-06-03 ENCOUNTER — Emergency Department (HOSPITAL_COMMUNITY): Payer: Medicare Other

## 2018-06-03 ENCOUNTER — Emergency Department (HOSPITAL_COMMUNITY)
Admission: EM | Admit: 2018-06-03 | Discharge: 2018-06-03 | Disposition: A | Discharge status: Home / Self Care | Payer: Medicare Other | Attending: Emergency Medicine | Admitting: Emergency Medicine

## 2018-06-03 ENCOUNTER — Other Ambulatory Visit: Payer: Self-pay

## 2018-06-03 DIAGNOSIS — Z7902 Long term (current) use of antithrombotics/antiplatelets: Secondary | ICD-10-CM | POA: Insufficient documentation

## 2018-06-03 DIAGNOSIS — Y999 Unspecified external cause status: Secondary | ICD-10-CM | POA: Diagnosis not present

## 2018-06-03 DIAGNOSIS — Z7982 Long term (current) use of aspirin: Secondary | ICD-10-CM | POA: Insufficient documentation

## 2018-06-03 DIAGNOSIS — R0902 Hypoxemia: Secondary | ICD-10-CM | POA: Diagnosis not present

## 2018-06-03 DIAGNOSIS — W19XXXA Unspecified fall, initial encounter: Secondary | ICD-10-CM

## 2018-06-03 DIAGNOSIS — R531 Weakness: Secondary | ICD-10-CM | POA: Diagnosis not present

## 2018-06-03 DIAGNOSIS — Z8546 Personal history of malignant neoplasm of prostate: Secondary | ICD-10-CM | POA: Insufficient documentation

## 2018-06-03 DIAGNOSIS — I10 Essential (primary) hypertension: Secondary | ICD-10-CM | POA: Diagnosis not present

## 2018-06-03 DIAGNOSIS — W0110XA Fall on same level from slipping, tripping and stumbling with subsequent striking against unspecified object, initial encounter: Secondary | ICD-10-CM | POA: Diagnosis not present

## 2018-06-03 DIAGNOSIS — Y9389 Activity, other specified: Secondary | ICD-10-CM | POA: Diagnosis not present

## 2018-06-03 DIAGNOSIS — M25551 Pain in right hip: Secondary | ICD-10-CM | POA: Diagnosis not present

## 2018-06-03 DIAGNOSIS — S0990XA Unspecified injury of head, initial encounter: Secondary | ICD-10-CM | POA: Insufficient documentation

## 2018-06-03 DIAGNOSIS — Y92002 Bathroom of unspecified non-institutional (private) residence single-family (private) house as the place of occurrence of the external cause: Secondary | ICD-10-CM | POA: Insufficient documentation

## 2018-06-03 DIAGNOSIS — Z79899 Other long term (current) drug therapy: Secondary | ICD-10-CM | POA: Diagnosis not present

## 2018-06-03 DIAGNOSIS — M791 Myalgia, unspecified site: Secondary | ICD-10-CM | POA: Diagnosis not present

## 2018-06-03 DIAGNOSIS — S32020A Wedge compression fracture of second lumbar vertebra, initial encounter for closed fracture: Secondary | ICD-10-CM | POA: Diagnosis not present

## 2018-06-03 DIAGNOSIS — S79911A Unspecified injury of right hip, initial encounter: Secondary | ICD-10-CM | POA: Diagnosis not present

## 2018-06-03 LAB — CBC WITH DIFFERENTIAL/PLATELET
ABS IMMATURE GRANULOCYTES: 0.07 10*3/uL (ref 0.00–0.07)
BASOS ABS: 0 10*3/uL (ref 0.0–0.1)
Basophils Relative: 0 %
Eosinophils Absolute: 0 10*3/uL (ref 0.0–0.5)
Eosinophils Relative: 0 %
HCT: 44 % (ref 39.0–52.0)
Hemoglobin: 14.9 g/dL (ref 13.0–17.0)
IMMATURE GRANULOCYTES: 1 %
LYMPHS ABS: 0.8 10*3/uL (ref 0.7–4.0)
LYMPHS PCT: 6 %
MCH: 32.3 pg (ref 26.0–34.0)
MCHC: 33.9 g/dL (ref 30.0–36.0)
MCV: 95.2 fL (ref 80.0–100.0)
Monocytes Absolute: 0.5 10*3/uL (ref 0.1–1.0)
Monocytes Relative: 4 %
NEUTROS PCT: 89 %
NRBC: 0 % (ref 0.0–0.2)
Neutro Abs: 11.4 10*3/uL — ABNORMAL HIGH (ref 1.7–7.7)
PLATELETS: 217 10*3/uL (ref 150–400)
RBC: 4.62 MIL/uL (ref 4.22–5.81)
RDW: 11.6 % (ref 11.5–15.5)
WBC: 12.8 10*3/uL — ABNORMAL HIGH (ref 4.0–10.5)

## 2018-06-03 LAB — COMPREHENSIVE METABOLIC PANEL
ALBUMIN: 4.1 g/dL (ref 3.5–5.0)
ALT: 24 U/L (ref 0–44)
AST: 36 U/L (ref 15–41)
Alkaline Phosphatase: 69 U/L (ref 38–126)
Anion gap: 10 (ref 5–15)
BUN: 17 mg/dL (ref 8–23)
CHLORIDE: 97 mmol/L — AB (ref 98–111)
CO2: 25 mmol/L (ref 22–32)
Calcium: 9.8 mg/dL (ref 8.9–10.3)
Creatinine, Ser: 1.1 mg/dL (ref 0.61–1.24)
GFR calc Af Amer: >60 mL/min (ref >60)
GFR calc non Af Amer: 58 mL/min — ABNORMAL LOW (ref >60)
GLUCOSE: 124 mg/dL — AB (ref 70–99)
POTASSIUM: 4.9 mmol/L (ref 3.5–5.1)
Sodium: 132 mmol/L — ABNORMAL LOW (ref 135–145)
Total Bilirubin: 1.1 mg/dL (ref 0.3–1.2)
Total Protein: 7.1 g/dL (ref 6.5–8.1)

## 2018-06-03 MED ORDER — TRAMADOL HCL 50 MG PO TABS
50.0000 mg | ORAL_TABLET | Freq: Once | ORAL | Status: AC
  Administered 2018-06-03: 50 mg via ORAL
  Filled 2018-06-03: qty 1

## 2018-06-03 MED ORDER — ACETAMINOPHEN 325 MG PO TABS
650.0000 mg | ORAL_TABLET | Freq: Once | ORAL | Status: AC
  Administered 2018-06-03: 650 mg via ORAL
  Filled 2018-06-03: qty 2

## 2018-06-03 NOTE — ED Provider Notes (Signed)
Medical screening examination/treatment/procedure(s) were conducted as a shared visit with non-physician practitioner(s) and myself.  I personally evaluated the patient during the encounter. Briefly, the patient is a 84 y.o. male with history of stroke with left-sided deficits who presents to the emergency department following a fall.  Patient states that he had a mechanical fall while trying to ambulate with his cane today.  Patient has had multiple falls due to overall weakness.  Patient denies any at his head, no loss of consciousness.  Complains mostly of right-sided hip pain.  No midline spinal tenderness on exam.  Patient is on blood thinners.  Patient has 12-hour home assist at home where he lives with his wife.  Family members at the bedside states that they have been trying to encourage him to go to a full-time facility due to generalized weakness.  Patient had x-rays performed that showed no acute fractures.  L2 spine fracture likely old as patient does not have tenderness here. Lab wnl. Patient had unremarkable head imaging.  Patient was able to ambulate without any pain while in the ED.  Recommend that he transition to 24-hour care but patient does not wish to have that help at this time and patient was discharged from ED in good condition.  This chart was dictated using voice recognition software.  Despite best efforts to proofread,  errors can occur which can change the documentation meaning.    EKG Interpretation  Date/Time:  Sunday June 03 2018 15:31:34 EDT Ventricular Rate:  85 PR Interval:    QRS Duration: 111 QT Interval:  385 QTC Calculation: 458 R Axis:   76 Text Interpretation:  Sinus rhythm Prolonged PR interval Low voltage, precordial leads Confirmed by Lennice Sites [phone]) on 06/03/2018 3:34:43 PM           Lennice Sites, DO 06/03/18 1957

## 2018-06-03 NOTE — ED Notes (Signed)
[name] (son in law): [phone] Will call in an hour.

## 2018-06-03 NOTE — ED Provider Notes (Signed)
[name] Provider Note   CSN: [id number] Arrival date & time: 06/03/18  1507     History   Chief Complaint Chief Complaint  Patient presents with  . Fall    HPI [name] is a 84 y.o. male.  The history is provided by the patient and medical records. No language interpreter was used.  Fall    [name] is a 84 y.o. male  with a PMH of as listed bvelow who presents to the Emergency Department from home by EMS for evaluation after mechanical fall this morning around 7:30 am. He as trying to use the restroom when his foot got tripped. He called a neighbor who helped him get up and called EMS. Patient reports landing on his right hip, then hitting the back of his head on the floor. He reports getting a scratch on his elbow as well, but denies any elbow pain. Tetanus is up to date. No LOC. Hx of prior stroke leaving left-sided deficits, but no new numbness or weakness. No bowel/bladder incontinence. No neck pain, chest pain, back pain or abdominal pain. He is on Plavix. Per EMS, his oxygen sat was 88% upon arrival. He was placed on 3L and quickly bumped up to 96%. He is not on any oxygen at home. He denies any shortness of breath. He has had a little cough/congestion lately, but states this is no more than a common cold. Denies hx of lung disease. No fevers/chills.   Past Medical History:  Diagnosis Date  . Abnormality of gait 09/15/2015  . Cancer ([place])   . Dyslipidemia   . Foot drop, left 09/15/2015  . Hemiparesis, aphasia, and dysphagia as late effects of stroke ([place]) 09/15/2015  . High cholesterol   . History of cerebrovascular disease    Right parietal, left cerebellar strokes, chronic  . Hypertension   . Prostate cancer ([place])    Radium seed implants  . Seizures ([place])    05/2013  . Stroke ([place])    04/20/1998  . TIA (transient ischemic attack)     Patient Active Problem List   Diagnosis Date Noted  . Nontraumatic  cortical hemorrhage of left cerebral hemisphere ([place]) 01/18/2017  . History of CVA (cerebrovascular accident)   . Abnormality of gait 09/15/2015  . Hemiparesis, aphasia, and dysphagia as late effects of stroke ([place]) 09/15/2015  . Foot drop, left 09/15/2015  . Seizure ([place]) 07/12/2013  . Stroke ([place])   . TIA (transient ischemic attack)   . Seizures ([place])   . Hypertension   . High cholesterol     Past Surgical History:  Procedure Laterality Date  . ABDOMINAL HERNIA REPAIR    . CAROTID ENDARTERECTOMY     Bilateral  . CATARACT EXTRACTION Bilateral   . Radium seed implants     Prostate cancer  . TONSILLECTOMY          Home Medications    Prior to Admission medications   Medication Sig Start Date End Date Taking? Authorizing Provider  acetaminophen (TYLENOL) 325 MG tablet Take 650 mg by mouth every 6 (six) hours as needed for headache (pain).   Yes [provider]  aspirin EC 81 MG tablet Take 81 mg by mouth daily.   Yes [provider]  cholecalciferol (VITAMIN D) 1000 units tablet Take 1,000 Units by mouth daily.   Yes [provider]  clopidogrel (PLAVIX) 75 MG tablet Take 75 mg by mouth at bedtime.    Yes [provider]  finasteride (PROSCAR) 5 MG tablet Take 5 mg by mouth at bedtime.    Yes [provider]  fluticasone (FLONASE) 50 MCG/ACT nasal spray Place 1 spray into both nostrils daily as needed for allergies or rhinitis.   Yes [provider]  levETIRAcetam (KEPPRA) 1000 MG tablet Take 1 tablet (1,000 mg total) by mouth 2 (two) times daily. 04/24/18  Yes Kathrynn Ducking, MD  losartan (COZAAR) 50 MG tablet Take 25 mg by mouth at bedtime.    Yes [provider]  Multiple Vitamin (MULTIVITAMIN WITH MINERALS) TABS tablet Take 1 tablet by mouth daily. [facility]   Yes [provider]  pantoprazole (PROTONIX) 40 MG tablet Take 40 mg by mouth at bedtime.    Yes [provider]  pravastatin  (PRAVACHOL) 20 MG tablet Take 20 mg by mouth at bedtime.   Yes [provider]  baclofen (LIORESAL) 10 MG tablet Take 0.5 tablets (5 mg total) by mouth 3 (three) times daily. Patient not taking: Reported on 06/03/2018 09/15/15   Kathrynn Ducking, MD    Family History Family History  Problem Relation Age of Onset  . Heart failure Brother   . Heart disease Brother   . Heart failure Mother   . Alcoholism Father   . Diabetes Daughter   . Diabetes Brother   . Heart disease Brother   . Heart disease Brother   . Diabetes Sister     Social History Social History   Tobacco Use  . Smoking status: Never Smoker  . Smokeless tobacco: Never Used  Substance Use Topics  . Alcohol use: No  . Drug use: No     Allergies   Ativan [lorazepam] and Tape   Review of Systems Review of Systems  Musculoskeletal: Positive for arthralgias and myalgias.  All other systems reviewed and are negative.    Physical Exam Updated Vital Signs BP 132/75   Pulse 100   Temp 97.8 F (36.6 C) (Oral)   Resp 18   Ht 5\' 8"  (1.727 m)   Wt 77.2 kg   SpO2 (!) 89% Comment: notified Kim, RN  BMI 25.88 kg/m   Physical Exam  Constitutional: He is oriented to person, place, and time. He appears well-developed and well-nourished. No distress.  HENT:  Head: Normocephalic and atraumatic.  No septal hematoma. No hemotympanum.   Neck:  No midline or paraspinal tenderness. Full ROM without difficulty.  Cardiovascular: Normal rate, regular rhythm and normal heart sounds.  No murmur heard. Pulmonary/Chest: Effort normal and breath sounds normal. No respiratory distress.  Abdominal: Soft. He exhibits no distension. There is no tenderness.  Musculoskeletal:  Reports pain to right hip, although no reproducible tenderness to the hip. Full ROM of the hip without pain. No T/L spine tenderness.  Neurological: He is alert and oriented to person, place, and time.  Alert, oriented, thought content appropriate,  able to give a coherent history. Speech is clear and goal oriented, able to follow commands. Cranial Nerves II-XII grossly intact. Decreased sensation to left upper and lower extremity which is baseline per patient. Contracture to LUE 2/2 prior stroke as well.  Skin: Skin is warm and dry.  Skin tear to the right elbow. No tenderness to the elbow. Full ROM. 5/5 strength to RUE.  Nursing note and vitals reviewed.    ED Treatments / Results  Labs (all labs ordered are listed, but only abnormal results are displayed) Labs Reviewed  CBC WITH DIFFERENTIAL/PLATELET - Abnormal; Notable for  the following components:      Result Value   WBC 12.8 (*)    Neutro Abs 11.4 (*)    All other components within normal limits  COMPREHENSIVE METABOLIC PANEL - Abnormal; Notable for the following components:   Sodium 132 (*)    Chloride 97 (*)    Glucose, Bld 124 (*)    GFR calc non Af Amer 58 (*)    All other components within normal limits    EKG EKG Interpretation  Date/Time:  Sunday June 03 2018 15:31:34 EDT Ventricular Rate:  85 PR Interval:    QRS Duration: 111 QT Interval:  385 QTC Calculation: 458 R Axis:   76 Text Interpretation:  Sinus rhythm Prolonged PR interval Low voltage, precordial leads Confirmed by Lennice Sites [id number]) on 06/03/2018 3:34:43 PM   Radiology Dg Lumbar Spine Complete  Result Date: 06/03/2018 CLINICAL DATA:  [place] at home. EXAM: LUMBAR SPINE - COMPLETE 4+ VIEW COMPARISON:  None. FINDINGS: There is a L2 compression deformity uncertain age. The other lumbar vertebral bodies appear maintained. Disc spaces are fairly well preserved. There is moderate to advanced facet disease and degenerative anterolisthesis of L4. The visualized bony pelvis is grossly intact. Severe atherosclerotic calcifications involving the aorta and iliac arteries with aneurysmal dilatation of the distal aorta estimated at 4.5 cm. IMPRESSION: 1. Compression fracture of L2 of uncertain age. 2.  Advanced atherosclerotic calcifications involving the aorta and iliac arteries with at least a 4 cm distal aortic aneurysm. Electronically Signed   By: Marijo Sanes M.D.   On: 06/03/2018 16:58   Ct Head Wo Contrast  Result Date: 06/03/2018 CLINICAL DATA:  Pt states he fell today. Hx of stroke with left side weakness. EXAM: CT HEAD WITHOUT CONTRAST TECHNIQUE: Contiguous axial images were obtained from the base of the skull through the vertex without intravenous contrast. COMPARISON:  CT of the head on 05/19/2017 FINDINGS: Brain: There is central and cortical atrophy. Periventricular white matter changes are consistent with small vessel disease. There is significant, stable encephalomalacia involving the RIGHT frontoparietal region. Stable appearance of a remote RIGHT cerebellar infarct. There is no intra or extra-axial fluid collection or mass lesion. The basilar cisterns and ventricles have a normal appearance. There is no CT evidence for acute infarction or hemorrhage. Vascular: There is dense atherosclerotic calcification of the internal carotid arteries. Skull: Normal. Negative for fracture or focal lesion. Sinuses/Orbits: No acute finding. Other: None. IMPRESSION: 1. Stable RIGHT frontoparietal encephalomalacia and remote RIGHT cerebellar infarct. 2. Atrophy and small vessel disease. 3.  No evidence for acute intracranial abnormality. Electronically Signed   By: Nolon Nations M.D.   On: 06/03/2018 16:49   Dg Hip Unilat W Or Wo Pelvis 2-3 Views Right  Result Date: 06/03/2018 CLINICAL DATA:  [place] at home.  Right hip pain. EXAM: DG HIP (WITH OR WITHOUT PELVIS) 2-3V RIGHT COMPARISON:  None. FINDINGS: The right hip is normally located. No acute fracture is identified. The right hemipelvis appears intact. Brachytherapy seeds are noted in the prostate gland. Advanced vascular calcifications. IMPRESSION: No acute hip fracture. Electronically Signed   By: Marijo Sanes M.D.   On: 06/03/2018 16:59     Procedures Procedures (including critical care time)  Medications Ordered in ED Medications  acetaminophen (TYLENOL) tablet 650 mg (has no administration in time range)  traMADol (ULTRAM) tablet 50 mg (50 mg Oral Given 06/03/18 1950)     Initial Impression / Assessment and Plan / ED Course  I have reviewed the triage  vital signs and the nursing notes.  Pertinent labs & imaging results that were available during my care of the patient were reviewed by me and considered in my medical decision making (see chart for details).    Yamir Carignan is a 84 y.o. male who presents to ED for evaluation following mechanical fall just prior to arrival.  He did hit his right hip and his head.  No focal neuro deficits on exam other than his residual deficits from prior stroke (left-sided weakness and sensory deficits).  CT head without acute findings.  Plain films of the hip negative as well.  Lumbar spine does show compression fracture of L2 which is age-indeterminate.  He does not have any focal tenderness to this area.  Doubt this is new from the fall today.  Did discuss this finding with his son and the patient and they will let PCP know at his upcoming appointment in a week or 2.  He had a skin tear to the right elbow which I repaired using Steri-Strips after thorough irrigation.  His tetanus is up-to-date.  Unfortunately he uses a cane to get around and has had frequent falls at home.  Myself and attending, Dr. Ronnald Nian, both spoke with the patient about frequent falls and possibility he may need more help at home vs. wheelchair vs. facility.  He did not have any canes or walkers available in the emergency department, however he was able to bear weight on the right lower extremity without any pain. I helped him take a few steps with a tech helping support him and he reported no discomfort with ambulation. Reasons to return to ER were discussed. All questions answered.   Patient seen by and discussed  with Dr. Ronnald Nian who agrees with treatment plan.    Final Clinical Impressions(s) / ED Diagnoses   Final diagnoses:  Fall, initial encounter    ED Discharge Orders    None       Ward, Ozella Almond, PA-C 06/03/18 [place], [place], DO 06/03/18 2331

## 2018-06-03 NOTE — ED Notes (Signed)
Attempted to ambulate Pt unsuccessfully. Pt states he normally uses a cane at home to get around, but there were no canes nor walkers in department. Pt attempted to use the handles on the back of a wheel chair, but this tech and Pt quickly agreed Pt was too unsteady for safe ambulation. Pt asked if he could ambulate well if he had a cane instead and Pt stated "yes, that's what I'm used to".

## 2018-06-03 NOTE — Discharge Instructions (Signed)
It was my pleasure taking care of you today!   Be very careful when you are walking around your home. Always use your cane.   Follow up with your primary care doctor. Let them know about the likely old compression fracture that we saw on the x-ray of your back.  Return to ER for new or worsening symptoms, any additional concerns.

## 2018-06-03 NOTE — ED Triage Notes (Addendum)
Pt to ED via [facility] after reported having a mechanical fall at home.  Pt c/o pain in right hip and abrasion to right elbow. No shortening or outward rotation noted to right hip  Pt alert and oriented X's 3

## 2018-06-04 ENCOUNTER — Telehealth: Payer: Self-pay | Admitting: Neurology

## 2018-06-04 NOTE — Telephone Encounter (Signed)
FYI pt has called to inform Dr Jannifer Franklin that he fell on yesterday morning.  Test were ran along with scans and all came back good.  No call back requested.

## 2018-06-06 ENCOUNTER — Ambulatory Visit: Payer: Self-pay | Admitting: Neurology

## 2018-06-19 DIAGNOSIS — Z6825 Body mass index (BMI) 25.0-25.9, adult: Secondary | ICD-10-CM | POA: Diagnosis not present

## 2018-06-19 DIAGNOSIS — S32000A Wedge compression fracture of unspecified lumbar vertebra, initial encounter for closed fracture: Secondary | ICD-10-CM | POA: Diagnosis not present

## 2018-06-19 DIAGNOSIS — M545 Low back pain: Secondary | ICD-10-CM | POA: Diagnosis not present

## 2018-06-19 DIAGNOSIS — W19XXXA Unspecified fall, initial encounter: Secondary | ICD-10-CM | POA: Diagnosis not present

## 2018-06-22 DIAGNOSIS — Z8546 Personal history of malignant neoplasm of prostate: Secondary | ICD-10-CM | POA: Diagnosis not present

## 2018-06-22 DIAGNOSIS — I69354 Hemiplegia and hemiparesis following cerebral infarction affecting left non-dominant side: Secondary | ICD-10-CM | POA: Diagnosis not present

## 2018-06-22 DIAGNOSIS — G40909 Epilepsy, unspecified, not intractable, without status epilepticus: Secondary | ICD-10-CM | POA: Diagnosis not present

## 2018-06-22 DIAGNOSIS — S32040D Wedge compression fracture of fourth lumbar vertebra, subsequent encounter for fracture with routine healing: Secondary | ICD-10-CM | POA: Diagnosis not present

## 2018-06-22 DIAGNOSIS — Q249 Congenital malformation of heart, unspecified: Secondary | ICD-10-CM | POA: Diagnosis not present

## 2018-06-22 DIAGNOSIS — Z9181 History of falling: Secondary | ICD-10-CM | POA: Diagnosis not present

## 2018-06-22 DIAGNOSIS — K2270 Barrett's esophagus without dysplasia: Secondary | ICD-10-CM | POA: Diagnosis not present

## 2018-06-22 DIAGNOSIS — E785 Hyperlipidemia, unspecified: Secondary | ICD-10-CM | POA: Diagnosis not present

## 2018-06-22 DIAGNOSIS — I10 Essential (primary) hypertension: Secondary | ICD-10-CM | POA: Diagnosis not present

## 2018-06-22 DIAGNOSIS — E559 Vitamin D deficiency, unspecified: Secondary | ICD-10-CM | POA: Diagnosis not present

## 2018-06-22 DIAGNOSIS — M1991 Primary osteoarthritis, unspecified site: Secondary | ICD-10-CM | POA: Diagnosis not present

## 2018-06-22 DIAGNOSIS — Z791 Long term (current) use of non-steroidal anti-inflammatories (NSAID): Secondary | ICD-10-CM | POA: Diagnosis not present

## 2018-06-28 ENCOUNTER — Ambulatory Visit: Payer: Self-pay | Admitting: Neurology

## 2018-07-23 DIAGNOSIS — Z6825 Body mass index (BMI) 25.0-25.9, adult: Secondary | ICD-10-CM | POA: Diagnosis not present

## 2018-07-23 DIAGNOSIS — I10 Essential (primary) hypertension: Secondary | ICD-10-CM | POA: Diagnosis not present

## 2018-07-23 DIAGNOSIS — E785 Hyperlipidemia, unspecified: Secondary | ICD-10-CM | POA: Diagnosis not present

## 2018-07-23 DIAGNOSIS — I6781 Acute cerebrovascular insufficiency: Secondary | ICD-10-CM | POA: Diagnosis not present

## 2018-10-22 DIAGNOSIS — E559 Vitamin D deficiency, unspecified: Secondary | ICD-10-CM | POA: Diagnosis not present

## 2018-10-22 DIAGNOSIS — I6781 Acute cerebrovascular insufficiency: Secondary | ICD-10-CM | POA: Diagnosis not present

## 2018-10-22 DIAGNOSIS — G40909 Epilepsy, unspecified, not intractable, without status epilepticus: Secondary | ICD-10-CM | POA: Diagnosis not present

## 2018-10-22 DIAGNOSIS — Z79899 Other long term (current) drug therapy: Secondary | ICD-10-CM | POA: Diagnosis not present

## 2018-10-22 DIAGNOSIS — E785 Hyperlipidemia, unspecified: Secondary | ICD-10-CM | POA: Diagnosis not present

## 2018-10-22 DIAGNOSIS — I10 Essential (primary) hypertension: Secondary | ICD-10-CM | POA: Diagnosis not present

## 2019-01-22 DIAGNOSIS — E785 Hyperlipidemia, unspecified: Secondary | ICD-10-CM | POA: Diagnosis not present

## 2019-01-22 DIAGNOSIS — I10 Essential (primary) hypertension: Secondary | ICD-10-CM | POA: Diagnosis not present

## 2019-01-22 DIAGNOSIS — E559 Vitamin D deficiency, unspecified: Secondary | ICD-10-CM | POA: Diagnosis not present

## 2019-01-22 DIAGNOSIS — I6781 Acute cerebrovascular insufficiency: Secondary | ICD-10-CM | POA: Diagnosis not present

## 2019-02-04 DIAGNOSIS — C441221 Squamous cell carcinoma of skin of right upper eyelid, including canthus: Secondary | ICD-10-CM | POA: Diagnosis not present

## 2019-02-04 DIAGNOSIS — C44729 Squamous cell carcinoma of skin of left lower limb, including hip: Secondary | ICD-10-CM | POA: Diagnosis not present

## 2019-02-04 DIAGNOSIS — L578 Other skin changes due to chronic exposure to nonionizing radiation: Secondary | ICD-10-CM | POA: Diagnosis not present

## 2019-02-04 DIAGNOSIS — Z85820 Personal history of malignant melanoma of skin: Secondary | ICD-10-CM | POA: Diagnosis not present

## 2019-04-03 DIAGNOSIS — Z20828 Contact with and (suspected) exposure to other viral communicable diseases: Secondary | ICD-10-CM | POA: Diagnosis not present

## 2019-04-23 DIAGNOSIS — G40909 Epilepsy, unspecified, not intractable, without status epilepticus: Secondary | ICD-10-CM | POA: Diagnosis not present

## 2019-04-23 DIAGNOSIS — E559 Vitamin D deficiency, unspecified: Secondary | ICD-10-CM | POA: Diagnosis not present

## 2019-04-23 DIAGNOSIS — I10 Essential (primary) hypertension: Secondary | ICD-10-CM | POA: Diagnosis not present

## 2019-04-23 DIAGNOSIS — E785 Hyperlipidemia, unspecified: Secondary | ICD-10-CM | POA: Diagnosis not present

## 2019-05-06 DIAGNOSIS — L578 Other skin changes due to chronic exposure to nonionizing radiation: Secondary | ICD-10-CM | POA: Diagnosis not present

## 2019-05-06 DIAGNOSIS — C44329 Squamous cell carcinoma of skin of other parts of face: Secondary | ICD-10-CM | POA: Diagnosis not present

## 2019-05-06 DIAGNOSIS — L821 Other seborrheic keratosis: Secondary | ICD-10-CM | POA: Diagnosis not present

## 2019-05-06 DIAGNOSIS — L570 Actinic keratosis: Secondary | ICD-10-CM | POA: Diagnosis not present

## 2019-06-03 DIAGNOSIS — Z23 Encounter for immunization: Secondary | ICD-10-CM | POA: Diagnosis not present

## 2019-06-03 DIAGNOSIS — Z79899 Other long term (current) drug therapy: Secondary | ICD-10-CM | POA: Diagnosis not present

## 2019-06-03 DIAGNOSIS — E785 Hyperlipidemia, unspecified: Secondary | ICD-10-CM | POA: Diagnosis not present

## 2019-06-03 DIAGNOSIS — E559 Vitamin D deficiency, unspecified: Secondary | ICD-10-CM | POA: Diagnosis not present

## 2019-07-24 DIAGNOSIS — M1990 Unspecified osteoarthritis, unspecified site: Secondary | ICD-10-CM | POA: Diagnosis not present

## 2019-07-24 DIAGNOSIS — G40909 Epilepsy, unspecified, not intractable, without status epilepticus: Secondary | ICD-10-CM | POA: Diagnosis not present

## 2019-07-24 DIAGNOSIS — K2270 Barrett's esophagus without dysplasia: Secondary | ICD-10-CM | POA: Diagnosis not present

## 2019-07-24 DIAGNOSIS — I10 Essential (primary) hypertension: Secondary | ICD-10-CM | POA: Diagnosis not present

## 2019-08-15 IMAGING — DX DG HIP (WITH OR WITHOUT PELVIS) 2-3V*R*
3 series · 3 of 3 positions shown · non-contrast
Comparison: None.

CLINICAL DATA: Fell at home.  Right hip pain.

EXAM:
DG HIP (WITH OR WITHOUT PELVIS) 2-3V RIGHT

[hip ap]
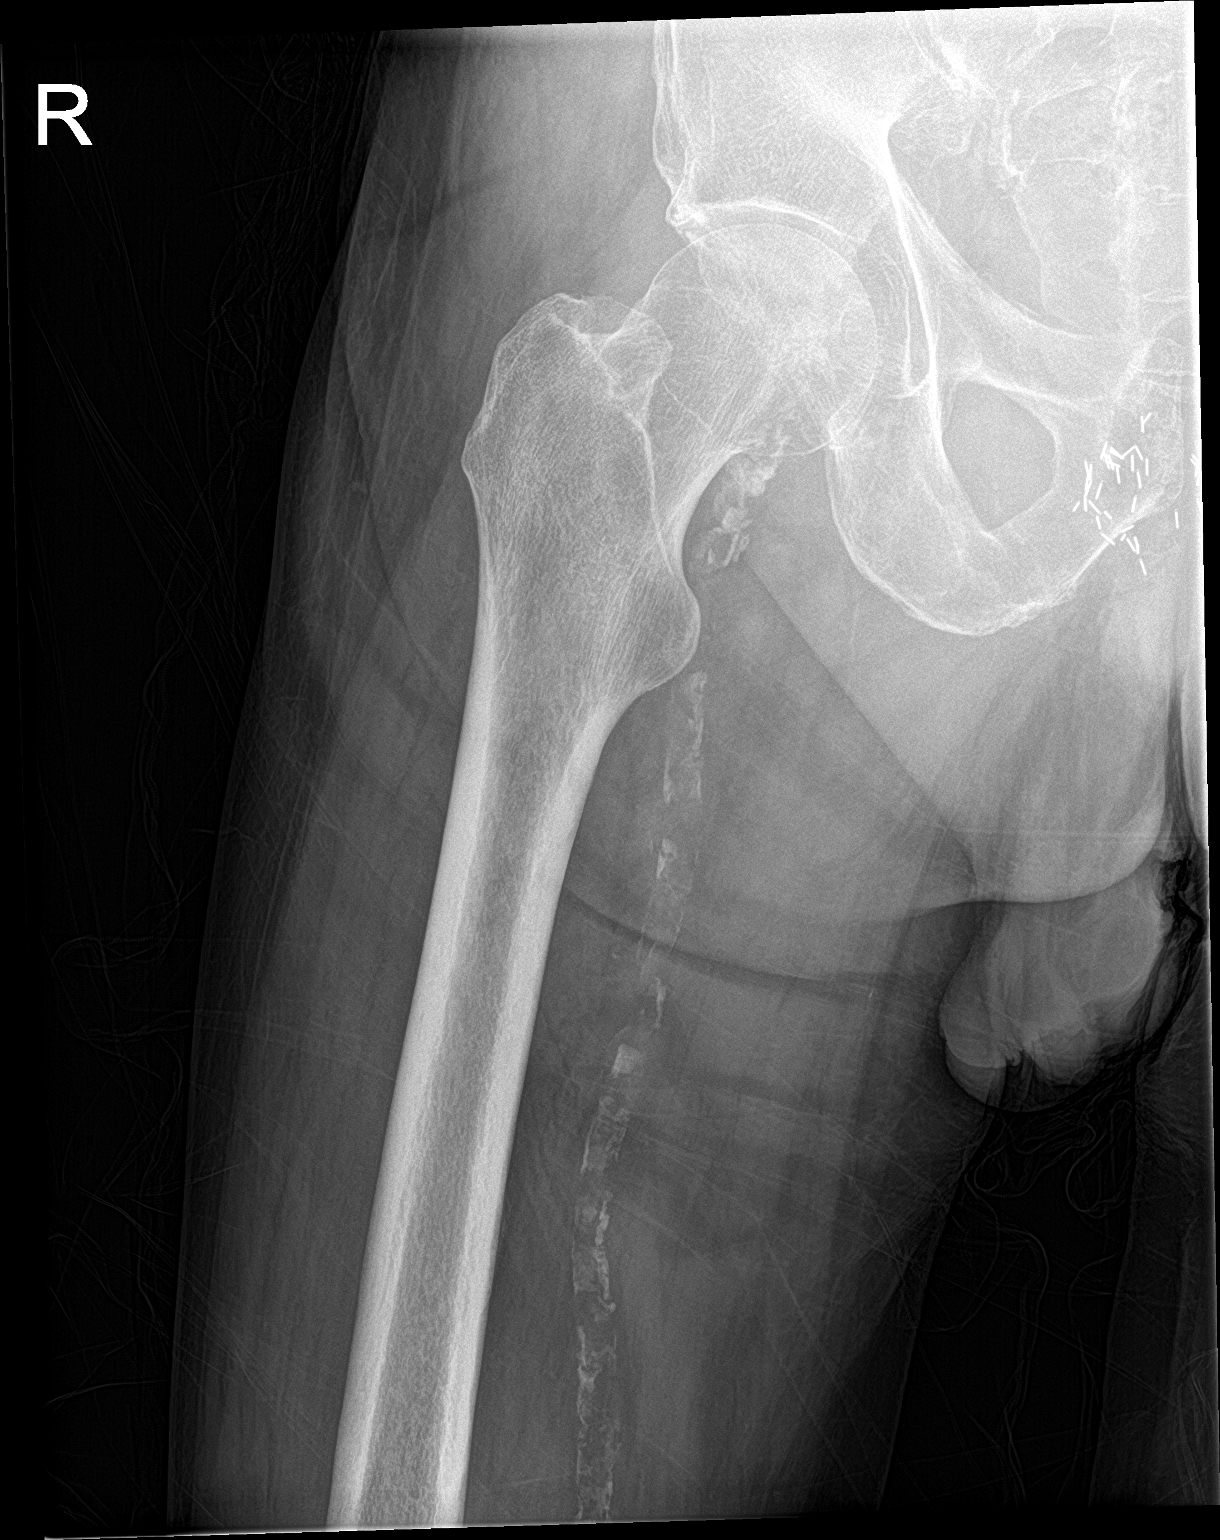

[hip lat]
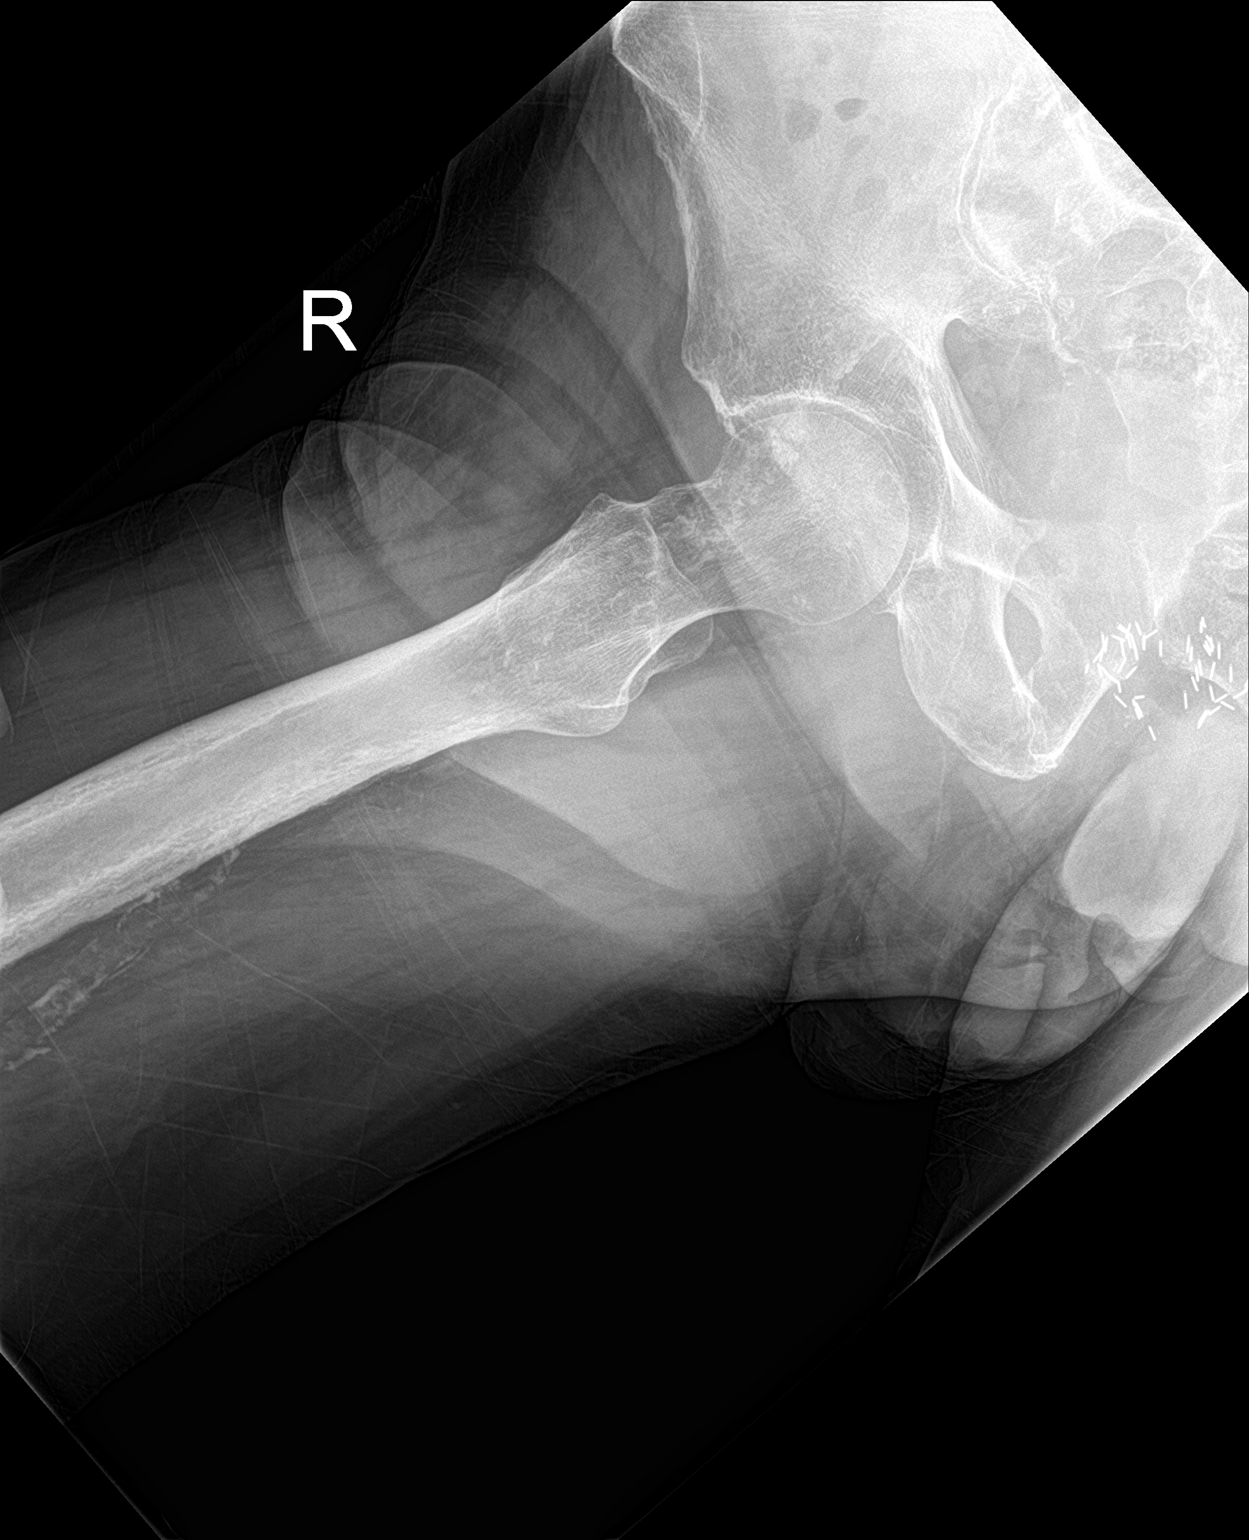

[pelvis ap]
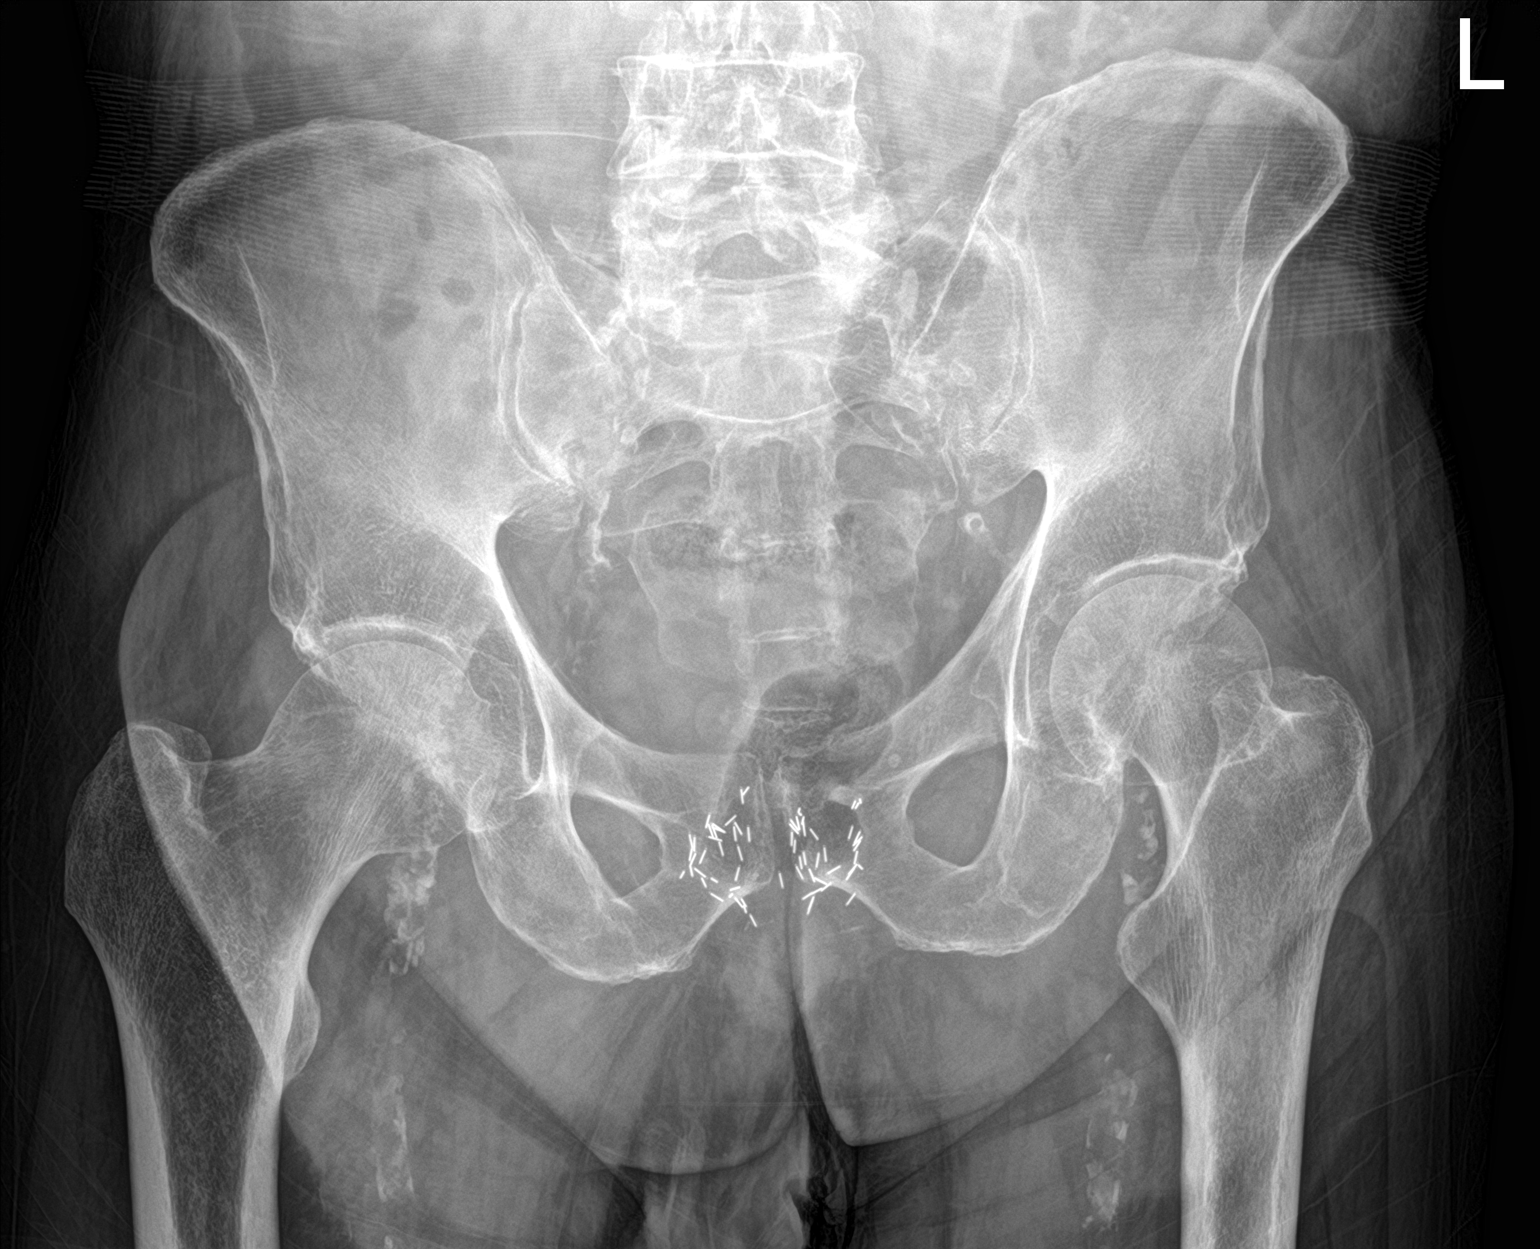

[3 of 3 positions shown; findings below may reference images not displayed]

FINDINGS: The right hip is normally located. No acute fracture is identified.
The right hemipelvis appears intact. Brachytherapy seeds are noted
in the prostate gland. Advanced vascular calcifications.
IMPRESSION: No acute hip fracture.

## 2019-10-23 DIAGNOSIS — M1990 Unspecified osteoarthritis, unspecified site: Secondary | ICD-10-CM | POA: Diagnosis not present

## 2019-10-23 DIAGNOSIS — I10 Essential (primary) hypertension: Secondary | ICD-10-CM | POA: Diagnosis not present

## 2019-10-23 DIAGNOSIS — K2270 Barrett's esophagus without dysplasia: Secondary | ICD-10-CM | POA: Diagnosis not present

## 2019-10-23 DIAGNOSIS — E785 Hyperlipidemia, unspecified: Secondary | ICD-10-CM | POA: Diagnosis not present

## 2019-12-24 DIAGNOSIS — G40909 Epilepsy, unspecified, not intractable, without status epilepticus: Secondary | ICD-10-CM | POA: Diagnosis not present

## 2019-12-24 DIAGNOSIS — I10 Essential (primary) hypertension: Secondary | ICD-10-CM | POA: Diagnosis not present

## 2019-12-24 DIAGNOSIS — E785 Hyperlipidemia, unspecified: Secondary | ICD-10-CM | POA: Diagnosis not present

## 2019-12-24 DIAGNOSIS — Z79899 Other long term (current) drug therapy: Secondary | ICD-10-CM | POA: Diagnosis not present

## 2019-12-24 DIAGNOSIS — E559 Vitamin D deficiency, unspecified: Secondary | ICD-10-CM | POA: Diagnosis not present

## 2019-12-27 DIAGNOSIS — E039 Hypothyroidism, unspecified: Secondary | ICD-10-CM | POA: Diagnosis not present

## 2019-12-27 DIAGNOSIS — N390 Urinary tract infection, site not specified: Secondary | ICD-10-CM | POA: Diagnosis not present

## 2020-03-30 DIAGNOSIS — E559 Vitamin D deficiency, unspecified: Secondary | ICD-10-CM | POA: Diagnosis not present

## 2020-03-30 DIAGNOSIS — I10 Essential (primary) hypertension: Secondary | ICD-10-CM | POA: Diagnosis not present

## 2020-03-30 DIAGNOSIS — G40909 Epilepsy, unspecified, not intractable, without status epilepticus: Secondary | ICD-10-CM | POA: Diagnosis not present

## 2020-03-30 DIAGNOSIS — E785 Hyperlipidemia, unspecified: Secondary | ICD-10-CM | POA: Diagnosis not present

## 2020-06-19 ENCOUNTER — Emergency Department (HOSPITAL_COMMUNITY): Payer: Medicare Other

## 2020-06-19 ENCOUNTER — Encounter (HOSPITAL_COMMUNITY): Payer: Self-pay | Admitting: Emergency Medicine

## 2020-06-19 ENCOUNTER — Inpatient Hospital Stay (HOSPITAL_COMMUNITY)
Admission: EM | Admit: 2020-06-19 | Discharge: 2020-06-22 | DRG: 280 | Disposition: A | Discharge status: Hospice | Payer: Medicare Other | Attending: Internal Medicine | Admitting: Internal Medicine

## 2020-06-19 DIAGNOSIS — K7200 Acute and subacute hepatic failure without coma: Secondary | ICD-10-CM | POA: Diagnosis present

## 2020-06-19 DIAGNOSIS — E7800 Pure hypercholesterolemia, unspecified: Secondary | ICD-10-CM | POA: Diagnosis present

## 2020-06-19 DIAGNOSIS — I214 Non-ST elevation (NSTEMI) myocardial infarction: Secondary | ICD-10-CM | POA: Diagnosis not present

## 2020-06-19 DIAGNOSIS — I2109 ST elevation (STEMI) myocardial infarction involving other coronary artery of anterior wall: Secondary | ICD-10-CM

## 2020-06-19 DIAGNOSIS — R1310 Dysphagia, unspecified: Secondary | ICD-10-CM | POA: Diagnosis present

## 2020-06-19 DIAGNOSIS — Z811 Family history of alcohol abuse and dependence: Secondary | ICD-10-CM

## 2020-06-19 DIAGNOSIS — L8990 Pressure ulcer of unspecified site, unspecified stage: Secondary | ICD-10-CM | POA: Insufficient documentation

## 2020-06-19 DIAGNOSIS — M21372 Foot drop, left foot: Secondary | ICD-10-CM | POA: Diagnosis present

## 2020-06-19 DIAGNOSIS — I4891 Unspecified atrial fibrillation: Secondary | ICD-10-CM | POA: Diagnosis not present

## 2020-06-19 DIAGNOSIS — Z888 Allergy status to other drugs, medicaments and biological substances status: Secondary | ICD-10-CM

## 2020-06-19 DIAGNOSIS — Z8249 Family history of ischemic heart disease and other diseases of the circulatory system: Secondary | ICD-10-CM | POA: Diagnosis not present

## 2020-06-19 DIAGNOSIS — Z79899 Other long term (current) drug therapy: Secondary | ICD-10-CM

## 2020-06-19 DIAGNOSIS — E785 Hyperlipidemia, unspecified: Secondary | ICD-10-CM | POA: Diagnosis present

## 2020-06-19 DIAGNOSIS — Z20822 Contact with and (suspected) exposure to covid-19: Secondary | ICD-10-CM | POA: Diagnosis present

## 2020-06-19 DIAGNOSIS — Z66 Do not resuscitate: Secondary | ICD-10-CM | POA: Diagnosis present

## 2020-06-19 DIAGNOSIS — E875 Hyperkalemia: Secondary | ICD-10-CM | POA: Diagnosis present

## 2020-06-19 DIAGNOSIS — R7989 Other specified abnormal findings of blood chemistry: Secondary | ICD-10-CM | POA: Diagnosis present

## 2020-06-19 DIAGNOSIS — I69320 Aphasia following cerebral infarction: Secondary | ICD-10-CM | POA: Diagnosis not present

## 2020-06-19 DIAGNOSIS — I69391 Dysphagia following cerebral infarction: Secondary | ICD-10-CM | POA: Diagnosis not present

## 2020-06-19 DIAGNOSIS — Z7982 Long term (current) use of aspirin: Secondary | ICD-10-CM

## 2020-06-19 DIAGNOSIS — I69354 Hemiplegia and hemiparesis following cerebral infarction affecting left non-dominant side: Secondary | ICD-10-CM

## 2020-06-19 DIAGNOSIS — I2102 ST elevation (STEMI) myocardial infarction involving left anterior descending coronary artery: Principal | ICD-10-CM | POA: Diagnosis present

## 2020-06-19 DIAGNOSIS — I110 Hypertensive heart disease with heart failure: Secondary | ICD-10-CM | POA: Diagnosis present

## 2020-06-19 DIAGNOSIS — R0902 Hypoxemia: Secondary | ICD-10-CM

## 2020-06-19 DIAGNOSIS — R570 Cardiogenic shock: Secondary | ICD-10-CM | POA: Diagnosis present

## 2020-06-19 DIAGNOSIS — N179 Acute kidney failure, unspecified: Secondary | ICD-10-CM | POA: Diagnosis not present

## 2020-06-19 DIAGNOSIS — G40909 Epilepsy, unspecified, not intractable, without status epilepticus: Secondary | ICD-10-CM | POA: Diagnosis present

## 2020-06-19 DIAGNOSIS — Z515 Encounter for palliative care: Secondary | ICD-10-CM | POA: Diagnosis not present

## 2020-06-19 DIAGNOSIS — Z923 Personal history of irradiation: Secondary | ICD-10-CM

## 2020-06-19 DIAGNOSIS — I255 Ischemic cardiomyopathy: Secondary | ICD-10-CM | POA: Diagnosis present

## 2020-06-19 DIAGNOSIS — Z7902 Long term (current) use of antithrombotics/antiplatelets: Secondary | ICD-10-CM

## 2020-06-19 DIAGNOSIS — Z833 Family history of diabetes mellitus: Secondary | ICD-10-CM

## 2020-06-19 DIAGNOSIS — I5041 Acute combined systolic (congestive) and diastolic (congestive) heart failure: Secondary | ICD-10-CM | POA: Diagnosis present

## 2020-06-19 DIAGNOSIS — Z7189 Other specified counseling: Secondary | ICD-10-CM

## 2020-06-19 DIAGNOSIS — E039 Hypothyroidism, unspecified: Secondary | ICD-10-CM | POA: Diagnosis present

## 2020-06-19 DIAGNOSIS — R0602 Shortness of breath: Secondary | ICD-10-CM | POA: Diagnosis not present

## 2020-06-19 DIAGNOSIS — E8770 Fluid overload, unspecified: Secondary | ICD-10-CM

## 2020-06-19 DIAGNOSIS — J9601 Acute respiratory failure with hypoxia: Secondary | ICD-10-CM | POA: Diagnosis not present

## 2020-06-19 DIAGNOSIS — Z8546 Personal history of malignant neoplasm of prostate: Secondary | ICD-10-CM

## 2020-06-19 DIAGNOSIS — I5082 Biventricular heart failure: Secondary | ICD-10-CM | POA: Diagnosis present

## 2020-06-19 LAB — COMPREHENSIVE METABOLIC PANEL
ALT: 76 U/L — ABNORMAL HIGH (ref 0–44)
AST: 308 U/L — ABNORMAL HIGH (ref 15–41)
Albumin: 2.9 g/dL — ABNORMAL LOW (ref 3.5–5.0)
Alkaline Phosphatase: 65 U/L (ref 38–126)
Anion gap: 12 (ref 5–15)
BUN: 53 mg/dL — ABNORMAL HIGH (ref 8–23)
CO2: 22 mmol/L (ref 22–32)
Calcium: 9.5 mg/dL (ref 8.9–10.3)
Chloride: 98 mmol/L (ref 98–111)
Creatinine, Ser: 2.56 mg/dL — ABNORMAL HIGH (ref 0.61–1.24)
GFR, Estimated: 23 mL/min — ABNORMAL LOW (ref >60)
Glucose, Bld: 159 mg/dL — ABNORMAL HIGH (ref 70–99)
Potassium: 7.3 mmol/L (ref 3.5–5.1)
Sodium: 132 mmol/L — ABNORMAL LOW (ref 135–145)
Total Bilirubin: 2.5 mg/dL — ABNORMAL HIGH (ref 0.3–1.2)
Total Protein: 6 g/dL — ABNORMAL LOW (ref 6.5–8.1)

## 2020-06-19 LAB — FERRITIN: Ferritin: 321 ng/mL (ref 24–336)

## 2020-06-19 LAB — URINALYSIS, ROUTINE W REFLEX MICROSCOPIC
Bilirubin Urine: NEGATIVE
Glucose, UA: 150 mg/dL — AB
Hgb urine dipstick: NEGATIVE
Ketones, ur: NEGATIVE mg/dL
Nitrite: NEGATIVE
Protein, ur: 30 mg/dL — AB
Specific Gravity, Urine: 1.02 (ref 1.005–1.030)
pH: 5 (ref 5.0–8.0)

## 2020-06-19 LAB — BASIC METABOLIC PANEL
Anion gap: 13 (ref 5–15)
BUN: 52 mg/dL — ABNORMAL HIGH (ref 8–23)
CO2: 23 mmol/L (ref 22–32)
Calcium: 9.5 mg/dL (ref 8.9–10.3)
Chloride: 98 mmol/L (ref 98–111)
Creatinine, Ser: 2.36 mg/dL — ABNORMAL HIGH (ref 0.61–1.24)
GFR, Estimated: 26 mL/min — ABNORMAL LOW (ref >60)
Glucose, Bld: 147 mg/dL — ABNORMAL HIGH (ref 70–99)
Potassium: 4.8 mmol/L (ref 3.5–5.1)
Sodium: 134 mmol/L — ABNORMAL LOW (ref 135–145)

## 2020-06-19 LAB — FIBRINOGEN: Fibrinogen: 552 mg/dL — ABNORMAL HIGH (ref 210–475)

## 2020-06-19 LAB — RESPIRATORY PANEL BY RT PCR (FLU A&B, COVID)
Influenza A by PCR: NEGATIVE
Influenza B by PCR: NEGATIVE
SARS Coronavirus 2 by RT PCR: NEGATIVE

## 2020-06-19 LAB — TRIGLYCERIDES: Triglycerides: 70 mg/dL (ref <150)

## 2020-06-19 LAB — TROPONIN I (HIGH SENSITIVITY)
Troponin I (High Sensitivity): >27000 ng/L (ref <18)
Troponin I (High Sensitivity): >27000 ng/L (ref <18)

## 2020-06-19 LAB — ECHOCARDIOGRAM LIMITED
S' Lateral: 3 cm
Single Plane A4C EF: 34.7 %

## 2020-06-19 LAB — CREATININE, URINE, RANDOM: Creatinine, Urine: 157.65 mg/dL

## 2020-06-19 LAB — D-DIMER, QUANTITATIVE: D-Dimer, Quant: 7.78 ug/mL-FEU — ABNORMAL HIGH (ref 0.00–0.50)

## 2020-06-19 LAB — C-REACTIVE PROTEIN: CRP: 23.8 mg/dL — ABNORMAL HIGH (ref <1.0)

## 2020-06-19 LAB — LACTIC ACID, PLASMA: Lactic Acid, Venous: 3.5 mmol/L (ref 0.5–1.9)

## 2020-06-19 LAB — BRAIN NATRIURETIC PEPTIDE: B Natriuretic Peptide: 1749.7 pg/mL — ABNORMAL HIGH (ref 0.0–100.0)

## 2020-06-19 LAB — PROCALCITONIN: Procalcitonin: 0.63 ng/mL

## 2020-06-19 LAB — LACTATE DEHYDROGENASE: LDH: 1303 U/L — ABNORMAL HIGH (ref 98–192)

## 2020-06-19 LAB — SODIUM, URINE, RANDOM: Sodium, Ur: <10 mmol/L

## 2020-06-19 MED ORDER — DEXTROSE 50 % IV SOLN
1.0000 | Freq: Once | INTRAVENOUS | Status: AC
  Administered 2020-06-19: 50 mL via INTRAVENOUS
  Filled 2020-06-19: qty 50

## 2020-06-19 MED ORDER — HEPARIN BOLUS VIA INFUSION
4000.0000 [IU] | Freq: Once | INTRAVENOUS | Status: AC
  Administered 2020-06-19: 4000 [IU] via INTRAVENOUS
  Filled 2020-06-19: qty 4000

## 2020-06-19 MED ORDER — LEVETIRACETAM 500 MG PO TABS
500.0000 mg | ORAL_TABLET | Freq: Two times a day (BID) | ORAL | Status: DC
  Administered 2020-06-20 – 2020-06-21 (×4): 500 mg via ORAL
  Filled 2020-06-19 (×5): qty 1

## 2020-06-19 MED ORDER — INSULIN ASPART 100 UNIT/ML ~~LOC~~ SOLN
5.0000 [IU] | Freq: Once | SUBCUTANEOUS | Status: AC
  Administered 2020-06-19: 5 [IU] via INTRAVENOUS

## 2020-06-19 MED ORDER — ASPIRIN 325 MG PO TABS
325.0000 mg | ORAL_TABLET | Freq: Every day | ORAL | Status: DC
  Administered 2020-06-19: 325 mg via ORAL
  Filled 2020-06-19: qty 1

## 2020-06-19 MED ORDER — CLOPIDOGREL BISULFATE 75 MG PO TABS
75.0000 mg | ORAL_TABLET | Freq: Every day | ORAL | Status: DC
  Administered 2020-06-20 – 2020-06-21 (×2): 75 mg via ORAL
  Filled 2020-06-19 (×2): qty 1

## 2020-06-19 MED ORDER — LEVETIRACETAM 500 MG PO TABS: 1000.0000 mg | ORAL_TABLET | Freq: Two times a day (BID) | ORAL | Status: DC

## 2020-06-19 MED ORDER — ASPIRIN 81 MG PO CHEW
81.0000 mg | CHEWABLE_TABLET | Freq: Every day | ORAL | Status: DC
  Administered 2020-06-20 – 2020-06-21 (×2): 81 mg via ORAL
  Filled 2020-06-19 (×2): qty 1

## 2020-06-19 MED ORDER — CALCIUM GLUCONATE-NACL 1-0.675 GM/50ML-% IV SOLN
1.0000 g | Freq: Once | INTRAVENOUS | Status: AC
  Administered 2020-06-19: 1000 mg via INTRAVENOUS
  Filled 2020-06-19: qty 50

## 2020-06-19 MED ORDER — SODIUM CHLORIDE 0.9% FLUSH
3.0000 mL | Freq: Two times a day (BID) | INTRAVENOUS | Status: DC
  Administered 2020-06-20: 3 mL via INTRAVENOUS

## 2020-06-19 MED ORDER — ACETAMINOPHEN 650 MG RE SUPP: 650.0000 mg | Freq: Four times a day (QID) | RECTAL | Status: DC | PRN

## 2020-06-19 MED ORDER — LEVOTHYROXINE SODIUM 25 MCG PO TABS
12.5000 ug | ORAL_TABLET | Freq: Every day | ORAL | Status: DC
  Administered 2020-06-20 – 2020-06-21 (×2): 12.5 ug via ORAL
  Filled 2020-06-19 (×2): qty 1

## 2020-06-19 MED ORDER — SODIUM CHLORIDE 0.9 % IV BOLUS
500.0000 mL | Freq: Once | INTRAVENOUS | Status: AC
  Administered 2020-06-19: 500 mL via INTRAVENOUS

## 2020-06-19 MED ORDER — ACETAMINOPHEN 325 MG PO TABS: 650.0000 mg | ORAL_TABLET | Freq: Four times a day (QID) | ORAL | Status: DC | PRN

## 2020-06-19 MED ORDER — HEPARIN (PORCINE) 25000 UT/250ML-% IV SOLN
900.0000 [IU]/h | INTRAVENOUS | Status: DC
  Administered 2020-06-19: 1000 [IU]/h via INTRAVENOUS
  Filled 2020-06-19 (×3): qty 250

## 2020-06-19 NOTE — Progress Notes (Signed)
Called by the rapid response nurse to see the pt.  He had progressive hypotension through the night.  He was being admitted to the PCU but if he is going to require vasoactive support then he will have to come to the ICU.  Had a long discussion with the pt and pts daughter concerning the plan of care.  Explained the options of care from full aggressive care to no escalation of care to [place].  He has a living will and wants to follow it but is hesitant to commit to it.  The daughters husband is on the way to the ER to help support the decisions being made.  At this moment he does not require vasoactive support but is at high risk for needing more advanced support.  Will follow up with the pt shortly to determine plan of care

## 2020-06-19 NOTE — ED Notes (Addendum)
Cardiologist at bedside at this time.

## 2020-06-19 NOTE — ED Notes (Signed)
Spoke with [facility], Cardiology. States that pt is not a candidate for cardio intervention. MD made aware of SBP 76. She states that she will get in touch with admitting to possibly starting levo.

## 2020-06-19 NOTE — ED Notes (Signed)
This Probation officer spoke with Posey Pronto, MD. Pt BP dropping SBP 75. MD at bedside at this time.

## 2020-06-19 NOTE — ED Triage Notes (Signed)
Patient BIB [facility] for shortness of breath that started x3 days ago. Tested positive for COVID with home test today. Lives at home alone and is visited regularly by home health who called 911 due to patient's shortness of breath. On EMS arrival to patient's home SpO2 74% on room air. 18g saline lock in right AC. Daughter Artelia Laroche is reachable if needed at [phone]

## 2020-06-19 NOTE — H&P (Signed)
History and Physical    [name] SRP:[id number] DOB: September 14, 1928 DOA: 06/19/2020  PCP: Nicholos Johns, MD  Patient coming from: Home via EMS  I have personally briefly reviewed patient's old medical records in [place]  Chief Complaint: Shortness of breath  HPI: [name] is a 86 y.o. male with medical history significant for history of CVA with residual left-sided hemiparesis, HTN, HLD, seizure disorder, prostate cancer who presents to the ED for evaluation of shortness of breath.  History is supplemented by caregiver at bedside.  Patient states 3 nights ago he had sudden onset of left shoulder pain accompanied by frequent nausea and vomiting.  He had associated shortness of breath.  He says he has chronic decreased sensation in his left arm from prior stroke and did not feel any pain radiate from his shoulder down his arm, up to his neck, jaw, or to his back.  His PCP was called and he was given antiemetics and an oral antibiotic.  These were not helping and today he took at home COVID-19 test which was apparently weakly positive.  His caregiver says she and patient's other caregivers in close contacts also to COVID-19 test which were negative.  Due to persistent symptoms, patient was brought to the ED via EMS.  ED Course:  Initial vitals showed BP 93/60, pulse 83, RR 28, temp 97.9 Fahrenheit, SPO2 92% on 6 L of home O2 via [place].  Per ED documentation, O2 saturation was 74% on room air with EMS.  Labs notable for potassium 7.3, sodium 132, bicarb 22, BUN 53, creatinine 2.56, serum glucose 159, AST 308, ALT 76, alk phos 65, total bilirubin 2.5, lactic acid 3.5, procalcitonin 0.63, LDH 1303, CRP 23.8, BNP 1749.7, D-dimer 7.78.  High-sensitivity troponin I is >27,000 x 2.  Portable chest x-ray showed cardiomegaly with central pulmonary vascular congestion and small left pleural effusion.  Cardiology were consulted and patient was felt to have a completed anterior MI.  He  was started on aspirin, Plavix, and IV heparin.  Patient was given 500 cc normal saline, IV calcium gluconate, and 5 units NovoLog with D50 to treat hyperkalemia.  Repeat potassium improved to 4.8.  PCCM were consulted and felt patient was stable for admission to stepdown unit.  2D echocardiogram was obtained which showed EF 20-25% and wall motion abnormalities consistent with a large LAD territory infarction.  Grade 2 diastolic dysfunction was noted as well as moderately reduced right ventricular systolic function.  Patient had a reported home positive Covid test.  SARS-CoV-2 PCR in the ED is negative.  Review of Systems: All systems reviewed and are negative except as documented in history of present illness above.   Past Medical History:  Diagnosis Date  . Abnormality of gait 09/15/2015  . Cancer ([place])   . Dyslipidemia   . Foot drop, left 09/15/2015  . Hemiparesis, aphasia, and dysphagia as late effects of stroke ([place]) 09/15/2015  . High cholesterol   . History of cerebrovascular disease    Right parietal, left cerebellar strokes, chronic  . Hypertension   . Prostate cancer ([place])    Radium seed implants  . Seizures ([place])    05/2013  . Stroke ([place])    04/20/1998  . TIA (transient ischemic attack)     Past Surgical History:  Procedure Laterality Date  . ABDOMINAL HERNIA REPAIR    . CAROTID ENDARTERECTOMY     Bilateral  . CATARACT EXTRACTION Bilateral   . Radium seed implants  Prostate cancer  . TONSILLECTOMY      Social History:  reports that he has never smoked. He has never used smokeless tobacco. He reports that he does not drink alcohol and does not use drugs.  Allergies  Allergen Reactions  . Ativan [Lorazepam] Other (See Comments)    Mood change/aggressiveness  . Tape Other (See Comments)    SKIN IS VERY THIN AND WILL TEAR AND BRUISE EASILY!!    Family History  Problem Relation Age of Onset  . Heart failure Brother   . Heart disease Brother   . Heart  failure Mother   . Alcoholism Father   . Diabetes Daughter   . Diabetes Brother   . Heart disease Brother   . Heart disease Brother   . Diabetes Sister      Prior to Admission medications   Medication Sig Start Date End Date Taking? Authorizing Provider  acetaminophen (TYLENOL) 325 MG tablet Take 325-650 mg by mouth every 6 (six) hours as needed for mild pain (or headaches).    Yes [provider]  aspirin EC 81 MG tablet Take 81 mg by mouth daily.   Yes [provider]  Biotin 1000 MCG tablet Take 1,000 mcg by mouth daily.   Yes [provider]  Cholecalciferol (VITAMIN D-3) 25 MCG (1000 UT) CAPS Take 1,000 Units by mouth daily.   Yes [provider]  clopidogrel (PLAVIX) 75 MG tablet Take 75 mg by mouth daily.    Yes [provider]  Dextromethorphan-guaiFENesin (MUCINEX DM) 30-600 MG TB12 Take 0.5 tablets by mouth in the morning.   Yes [provider]  fluticasone (FLONASE) 50 MCG/ACT nasal spray Place 1 spray into both nostrils daily as needed for allergies or rhinitis.   Yes [provider]  levETIRAcetam (KEPPRA) 500 MG tablet Take 1,000 mg by mouth 2 (two) times daily.   Yes [provider]  levofloxacin (LEVAQUIN) 500 MG tablet Take 500 mg by mouth daily. FOR 7 DAYS 06/30/2020  Yes [provider]  LEVOXYL 25 MCG tablet Take 12.5 mcg by mouth daily before breakfast.   Yes [provider]  loratadine (CLARITIN) 10 MG tablet Take 10 mg by mouth daily as needed for allergies or rhinitis.   Yes [provider]  losartan (COZAAR) 50 MG tablet Take 25 mg by mouth daily.    Yes [provider]  Misc Natural Products (ELDERBERRY ZINC/VIT C/IMMUNE) LOZG Use as directed 1 lozenge in the mouth or throat daily.   Yes [provider]  Multiple Vitamin (MULTIVITAMIN WITH MINERALS) TABS tablet Take 1 tablet by mouth daily. [facility]   Yes [provider]  pantoprazole  (PROTONIX) 40 MG tablet Take 40 mg by mouth daily before breakfast.    Yes [provider]  pravastatin (PRAVACHOL) 40 MG tablet Take 40 mg by mouth daily.   Yes [provider]  predniSONE (DELTASONE) 10 MG tablet Take 10-60 mg by mouth See admin instructions. Take 60 mg by mouth in the morning with breakfast and decrease by 10 mg daily until finished 06/25/20  Yes [provider]  promethazine (PHENERGAN) 12.5 MG tablet Take 12.5-25 mg by mouth every 12 (twelve) hours as needed for nausea or vomiting.   Yes [provider]  T[place] 1200 MG CAPS Take 2,400 mg by mouth daily.   Yes [provider]  vitamin B-12 (CYANOCOBALAMIN) 500 MCG tablet Take 500 mcg by mouth daily.   Yes [provider]  vitamin C (ASCORBIC ACID) 250 MG tablet Take 250 mg by mouth daily.   Yes [provider]  baclofen (LIORESAL) 10 MG tablet Take 0.5 tablets (5 mg total) by mouth 3 (three) times daily. Patient not taking: Reported on 06/19/2020 09/15/15   Kathrynn Ducking, MD  finasteride (PROSCAR) 5 MG tablet Take 5 mg by mouth daily.     [provider]  levETIRAcetam (KEPPRA) 1000 MG tablet Take 1 tablet (1,000 mg total) by mouth 2 (two) times daily. Patient not taking: Reported on 06/19/2020 04/24/18   Kathrynn Ducking, MD    Physical Exam: Vitals:   06/19/20 2245 06/19/20 2300 06/19/20 2315 06/19/20 2330  BP: (!) 81/62 (!) 81/62 (!) 84/62 (!) 83/51  Pulse: 70 72 71 73  Resp: 20 (!) 24 (!) 24 (!) 24  Temp:      TempSrc:      SpO2: 94% 94% 93% (!) 85%  Weight:      Height:       Constitutional: Frail-appearing elderly man resting in bed with head elevated, NAD, calm, comfortable Eyes: PERRL, lids and conjunctivae normal ENMT: Mucous membranes are moist. Posterior pharynx clear of any exudate or lesions.Normal dentition.  Neck: normal, supple, no masses. Respiratory: Bibasilar crackles.  Normal respiratory effort. No accessory muscle  use.  Cardiovascular: Regular rate and rhythm, no murmurs / rubs / gallops. No extremity edema.  Cool extremities with 1+ pedal pulses. Abdomen: no tenderness, no masses palpated. No hepatosplenomegaly. Bowel sounds positive.  Musculoskeletal: no clubbing / cyanosis. No joint deformity upper and lower extremities. ROM diminished at left upper and lower extremities due to prior stroke, no contractures. Normal muscle tone.  Skin: no rashes, lesions, ulcers. No induration Neurologic: CN 2-12 grossly intact. Sensation diminished left upper extremity due to prior stroke, Strength 3/5 LUE and 4/5 LLE due to prior stroke, 5/5 right upper and lower extremities.   Psychiatric: Normal judgment and insight. Alert and oriented x 3. Normal mood.   Labs on Admission: I have personally reviewed following labs and imaging studies  CBC: No results for input(s): WBC, NEUTROABS, HGB, HCT, MCV, PLT in the last 168 hours. Basic Metabolic Panel: Recent Labs  Lab 06/19/20 1404 06/19/20 1618  NA 132* 134*  K 7.3* 4.8  CL 98 98  CO2 22 23  GLUCOSE 159* 147*  BUN 53* 52*  CREATININE 2.56* 2.36*  CALCIUM 9.5 9.5   GFR: Estimated Creatinine Clearance: 20.5 mL/min (A) (by C-G formula based on SCr of 2.36 mg/dL (H)). Liver Function Tests: Recent Labs  Lab 06/19/20 1404  AST 308*  ALT 76*  ALKPHOS 65  BILITOT 2.5*  PROT 6.0*  ALBUMIN 2.9*   No results for input(s): LIPASE, AMYLASE in the last 168 hours. No results for input(s): AMMONIA in the last 168 hours. Coagulation Profile: No results for input(s): INR, PROTIME in the last 168 hours. Cardiac Enzymes: No results for input(s): CKTOTAL, CKMB, CKMBINDEX, TROPONINI in the last 168 hours. BNP (last 3 results) No results for input(s): PROBNP in the last 8760 hours. HbA1C: No results for input(s): HGBA1C in the last 72 hours. CBG: No results for input(s): GLUCAP in the last 168 hours. Lipid Profile: Recent Labs    06/19/20 1404  TRIG 70    Thyroid Function Tests: No results for input(s): TSH, T4TOTAL, FREET4, T3FREE, THYROIDAB in the last 72 hours. Anemia Panel: Recent Labs    06/19/20 1404  FERRITIN 321   Urine analysis:    Component Value  Date/Time   COLORURINE YELLOW 06/19/2020 2114   APPEARANCEUR HAZY (A) 06/19/2020 2114   LABSPEC 1.020 06/19/2020 2114   PHURINE 5.0 06/19/2020 2114   GLUCOSEU 150 (A) 06/19/2020 2114   HGBUR NEGATIVE 06/19/2020 2114   BILIRUBINUR NEGATIVE 06/19/2020 2114   [place] 06/19/2020 2114   PROTEINUR 30 (A) 06/19/2020 2114   NITRITE NEGATIVE 06/19/2020 2114   LEUKOCYTESUR MODERATE (A) 06/19/2020 2114    Radiological Exams on Admission: DG Chest Port 1 View  Result Date: 06/19/2020 CLINICAL DATA:  Shortness of breath. EXAM: PORTABLE CHEST 1 VIEW COMPARISON:  November 10, 2016. FINDINGS: Mild cardiomegaly is noted with probable central pulmonary vascular congestion. No pneumothorax is noted. Mild bibasilar subsegmental atelectasis is noted. Small left pleural effusion is noted. Degenerative changes are seen involving the right shoulder. IMPRESSION: Mild cardiomegaly with probable central pulmonary vascular congestion. Mild bibasilar subsegmental atelectasis. Small left pleural effusion. Aortic Atherosclerosis (ICD10-I70.0). Electronically Signed   By: Marijo Conception M.D.   On: 06/19/2020 13:54   ECHOCARDIOGRAM LIMITED  Result Date: 06/19/2020    ECHOCARDIOGRAM LIMITED REPORT   Patient Name:   [name] Date of Exam: 06/19/2020 Medical Rec #:  [id number]         Height:       68.0 in Accession #:    [id number]        Weight:       170.2 lb Date of Birth:  1928/08/28        BSA:          1.908 m Patient Age:    78 years          BP:           102/84 mmHg Patient Gender: M                 HR:           90 bpm. Exam Location:  Inpatient Procedure: Limited Echo, Limited Color Doppler and Cardiac Doppler Indications:    acute myocardial infarction 410  History:        Patient has  prior history of Echocardiogram examinations, most                 recent 07/12/2013. Stroke; Risk Factors:Hypertension and                 Dyslipidemia.  Sonographer:    Johny Chess Referring Phys: [id number] [place]  1. Left ventricular ejection fraction, by estimation, is 20 to 25%. The left ventricle has severely decreased function. The left ventricle demonstrates regional wall motion abnormalities with mid to apical anteroseptal and inferoseptal akinesis, mid to apical anterolateral and inferolateral akinesis, apical anterior and apical inferior akinesis, and akinesis of the true apex. This is consistent with large LAD territory infarction. Left ventricular diastolic parameters are consistent with Grade II diastolic dysfunction (pseudonormalization).  2. Right ventricular systolic function is moderately reduced. The right ventricular size is mildly enlarged.  3. Left atrial size was mildly dilated.  4. Right atrial size was mildly dilated.  5. The mitral valve is normal in structure. Trivial mitral valve regurgitation. No evidence of mitral stenosis.  6. The aortic valve is tricuspid. Aortic valve regurgitation is not visualized. Mild to moderate aortic valve sclerosis/calcification is present, without any evidence of aortic stenosis.  7. The IVC was not visualized. Peak RV-RA gradient 36 mmHg. FINDINGS  Left Ventricle: Left ventricular ejection fraction, by estimation, is 20 to 25%. The left ventricle has  severely decreased function. The left ventricle demonstrates regional wall motion abnormalities. The left ventricular internal cavity size was normal  in size. There is no left ventricular hypertrophy. Left ventricular diastolic parameters are consistent with Grade II diastolic dysfunction (pseudonormalization). Right Ventricle: The right ventricular size is mildly enlarged. No increase in right ventricular wall thickness. Right ventricular systolic function is moderately reduced. Left  Atrium: Left atrial size was mildly dilated. Right Atrium: Right atrial size was mildly dilated. Pericardium: There is no evidence of pericardial effusion. Mitral Valve: The mitral valve is normal in structure. Mild mitral annular calcification. Trivial mitral valve regurgitation. No evidence of mitral valve stenosis. Tricuspid Valve: The tricuspid valve is normal in structure. Tricuspid valve regurgitation is trivial. Aortic Valve: The aortic valve is tricuspid. Aortic valve regurgitation is not visualized. Mild to moderate aortic valve sclerosis/calcification is present, without any evidence of aortic stenosis. Venous: The inferior vena cava was not well visualized. IAS/Shunts: No atrial level shunt detected by color flow Doppler. LEFT VENTRICLE PLAX 2D LVIDd:         3.60 cm     Diastology LVIDs:         3.00 cm     LV e' medial:    3.48 cm/s LV PW:         0.90 cm     LV E/e' medial:  18.5 LV IVS:        1.00 cm     LV e' lateral:   9.68 cm/s                            LV E/e' lateral: 6.6  LV Volumes (MOD) LV vol d, MOD A4C: 70.3 ml LV vol s, MOD A4C: 45.9 ml LV SV MOD A4C:     70.3 ml RIGHT VENTRICLE TAPSE (M-mode): 1.7 cm LEFT ATRIUM         Index LA diam:    3.80 cm 1.99 cm/m  MV E velocity: 64.30 cm/s  TRICUSPID VALVE MV A velocity: 53.60 cm/s  TR Peak grad:   36.2 mmHg MV E/A ratio:  1.20        TR Vmax:        301.00 cm/s Loralie Champagne MD Electronically signed by Loralie Champagne MD Signature Date/Time: 06/19/2020/5:39:27 PM    Final     EKG: Personally reviewed. Sinus rhythm, first-degree AV block, low voltage, Q waves V2-V4.  Low voltage and Q waves are new when compared to previous EKG.  Assessment/Plan Principal Problem:   NSTEMI (non-ST elevated myocardial infarction) ([place]) Active Problems:   Seizure disorder ([place])   Acute combined systolic and diastolic congestive heart failure ([facility])   Acute respiratory failure with hypoxia ([facility])   AKI (acute kidney injury) ([place])   Elevated LFTs    Hyperkalemia   Hypothyroidism  Valiant Dills is a 86 y.o. male with medical history significant for history of CVA with residual left-sided hemiparesis, HTN, HLD, seizure disorder, prostate cancer who is admitted with NSTEMI and acute biventricular heart failure.  NSTEMI Acute combined systolic and diastolic biventricular congestive heart failure: Echo shows EF 20-25%, G2 DD, significant wall motion abnormality consistent with LAD territory infarct.  Seen by cardiology earlier today and started on aspirin, Plavix, and heparin.  Also seen by PCCM who initially felt patient stable for admission to stepdown unit.  Patient is having progressive drop in blood pressure concerning for cardiogenic shock and at this time likely needs higher  level of care for potential initiation of inotropes.  Cannot diurese at this time due to hypotension. I discussed the clinical picture with patient and his caregiver at bedside including that he has had a significant heart attack with acute CHF. Patient states that at this time he would like to pursue any aggressive measures indicated including IV pressors or inotropes, CPR, and mechanical ventilation at least initially. He said however he would not want to be on long-term life support. I discussed with on-call cardiology who stated that patient is a missed STEMI and is not a cardiac cath candidate due to his acute renal injury. They stated that there is not much else to offer for this patient other than potential use of Levophed or dobutamine. I asked that the cardiology team discuss the situation with patient and give their formal recommendations to best determine further plan of care. -Cardiology and PCCM following -Per my discussion, patient wishes to be full code. He is high risk for further decompensation and will soon require pressor/inotropic support. If this is the case he will need to be admitted to the ICU. -Continue IV heparin -Continue aspirin and  Plavix  Acute respiratory failure with hypoxia: Secondary to acute CHF as above. Cannot diurese due to hypotension.  Acute kidney injury: Likely related to poor cardiac output from acute MI.  Last creatinine in our system was 1.10 in October 2019.  Hyperkalemia: Improved after treatment in the ED.  Continue to monitor.  Elevated LFTs: Likely hepatic congestion related to acute MI.  Hypertension: Patient states he has been taken off antihypertensives at home due to hypotension. He is hypotensive now and may require pressor support as above.  Hyperlipidemia: Statin on hold with liver dysfunction.  Hypothyroidism: Continue Synthroid.  Seizure disorder: Continue renal dosed Keppra.  History of CVA with residual left-sided hemiparesis: Continue aspirin, Plavix.  Reported positive home Covid test: Weakly positive test at home per caregiver.  PCR is negative here.  Doubt active COVID-19 infection and would not keep on precautions for repeat test at this time.  Presenting illness can be explained by acute MI.   DVT prophylaxis: Heparin Code Status: Full code per patient at time of my evaluation. Family Communication: Discussed with patient's caregiver at bedside Disposition Plan: From home, dispo pending further decisions regarding medical management of acute MI with biventricular heart failure. Consults called: Cardiology, PCCM Admission status:  Status is: Inpatient  Remains inpatient appropriate because:Hemodynamically unstable, IV treatments appropriate due to intensity of illness or inability to take PO and Inpatient level of care appropriate due to severity of illness   Dispo: The patient is from: Home              Anticipated d/c is to: SNF versus hospice              Anticipated d/c date is: 3 days              Patient currently is not medically stable to d/c.   Zada Finders MD [facility]  If 7PM-7AM, please contact  night-coverage [email or web address]  06/20/2020, 12:26 AM

## 2020-06-19 NOTE — ED Provider Notes (Signed)
[facility] EMERGENCY DEPARTMENT Provider Note   CSN: [id number] Arrival date & time: 06/19/20  1319     History Chief Complaint  Patient presents with  . Shortness of Breath    [name] is a 86 y.o. male.  Presents to the emergency room with concern for shortness of breath, Covid.  Symptoms ongoing for the past 3 days, at home Covid test was positive.  EMS reports oxygen saturation 74% on room air.  Patient denies any associated chest pain, no new symptoms. reports that he has been vaccinated against Covid.  Additional history obtained from caregiver, reports that at home test was faintly positive for Covid.  HPI     Past Medical History:  Diagnosis Date  . Abnormality of gait 09/15/2015  . Cancer ([place])   . Dyslipidemia   . Foot drop, left 09/15/2015  . Hemiparesis, aphasia, and dysphagia as late effects of stroke ([place]) 09/15/2015  . High cholesterol   . History of cerebrovascular disease    Right parietal, left cerebellar strokes, chronic  . Hypertension   . Prostate cancer ([place])    Radium seed implants  . Seizures ([place])    05/2013  . Stroke ([place])    04/20/1998  . TIA (transient ischemic attack)     Patient Active Problem List   Diagnosis Date Noted  . Nontraumatic cortical hemorrhage of left cerebral hemisphere ([place]) 01/18/2017  . History of CVA (cerebrovascular accident)   . Abnormality of gait 09/15/2015  . Hemiparesis, aphasia, and dysphagia as late effects of stroke ([place]) 09/15/2015  . Foot drop, left 09/15/2015  . Seizure ([place]) 07/12/2013  . Stroke ([place])   . TIA (transient ischemic attack)   . Seizures ([place])   . Hypertension   . High cholesterol     Past Surgical History:  Procedure Laterality Date  . ABDOMINAL HERNIA REPAIR    . CAROTID ENDARTERECTOMY     Bilateral  . CATARACT EXTRACTION Bilateral   . Radium seed implants     Prostate cancer  . TONSILLECTOMY         Family History  Problem Relation Age of Onset  .  Heart failure Brother   . Heart disease Brother   . Heart failure Mother   . Alcoholism Father   . Diabetes Daughter   . Diabetes Brother   . Heart disease Brother   . Heart disease Brother   . Diabetes Sister     Social History   Tobacco Use  . Smoking status: Never Smoker  . Smokeless tobacco: Never Used  Substance Use Topics  . Alcohol use: No  . Drug use: No    Home Medications Prior to Admission medications   Medication Sig Start Date End Date Taking? Authorizing Provider  acetaminophen (TYLENOL) 325 MG tablet Take 325-650 mg by mouth every 6 (six) hours as needed for mild pain (or headaches).    Yes [provider]  aspirin EC 81 MG tablet Take 81 mg by mouth daily.   Yes [provider]  Biotin 1000 MCG tablet Take 1,000 mcg by mouth daily.   Yes [provider]  Cholecalciferol (VITAMIN D-3) 25 MCG (1000 UT) CAPS Take 1,000 Units by mouth daily.   Yes [provider]  clopidogrel (PLAVIX) 75 MG tablet Take 75 mg by mouth daily.    Yes [provider]  Dextromethorphan-guaiFENesin (MUCINEX DM) 30-600 MG TB12 Take 0.5 tablets by mouth in the morning.   Yes [provider]  fluticasone (FLONASE) 50 MCG/ACT nasal spray Place 1 spray into both nostrils daily as needed for allergies or rhinitis.   Yes [provider]  levETIRAcetam (KEPPRA) 500 MG tablet Take 1,000 mg by mouth 2 (two) times daily.   Yes [provider]  levofloxacin (LEVAQUIN) 500 MG tablet Take 500 mg by mouth daily. FOR 7 DAYS July 06, 2020  Yes [provider]  LEVOXYL 25 MCG tablet Take 12.5 mcg by mouth daily before breakfast.   Yes [provider]  loratadine (CLARITIN) 10 MG tablet Take 10 mg by mouth daily as needed for allergies or rhinitis.   Yes [provider]  losartan (COZAAR) 50 MG tablet Take 25 mg by mouth daily.    Yes [provider]  Misc Natural Products (ELDERBERRY ZINC/VIT C/IMMUNE) LOZG  Use as directed 1 lozenge in the mouth or throat daily.   Yes [provider]  Multiple Vitamin (MULTIVITAMIN WITH MINERALS) TABS tablet Take 1 tablet by mouth daily. Cent[facility] Senior   Yes [provider]  pantoprazole (PROTONIX) 40 MG tablet Take 40 mg by mouth daily before breakfast.    Yes [provider]  pravastatin (PRAVACHOL) 40 MG tablet Take 40 mg by mouth daily.   Yes [provider]  predniSONE (DELTASONE) 10 MG tablet Take 10-60 mg by mouth See admin instructions. Take 60 mg by mouth in the morning with breakfast and decrease by 10 mg daily until finished 06/25/20  Yes [provider]  promethazine (PHENERGAN) 12.5 MG tablet Take 12.5-25 mg by mouth every 12 (twelve) hours as needed for nausea or vomiting.   Yes [provider]  Tart Cherry 1200 MG CAPS Take 2,400 mg by mouth daily.   Yes [provider]  vitamin B-12 (CYANOCOBALAMIN) 500 MCG tablet Take 500 mcg by mouth daily.   Yes [provider]  vitamin C (ASCORBIC ACID) 250 MG tablet Take 250 mg by mouth daily.   Yes [provider]  baclofen (LIORESAL) 10 MG tablet Take 0.5 tablets (5 mg total) by mouth 3 (three) times daily. Patient not taking: Reported on 06/19/2020 09/15/15   Kathrynn Ducking, MD  finasteride (PROSCAR) 5 MG tablet Take 5 mg by mouth daily.     [provider]  levETIRAcetam (KEPPRA) 1000 MG tablet Take 1 tablet (1,000 mg total) by mouth 2 (two) times daily. Patient not taking: Reported on 06/19/2020 04/24/18   Kathrynn Ducking, MD    Allergies    Ativan [lorazepam] and Tape  Review of Systems   Review of Systems  Constitutional: Positive for fatigue. Negative for fever.  HENT: Negative for ear pain and sore throat.   Eyes: Negative for pain and visual disturbance.  Respiratory: Positive for shortness of breath.   Cardiovascular: Negative for chest pain and palpitations.  Gastrointestinal: Negative for abdominal pain  and vomiting.  Genitourinary: Negative for dysuria and hematuria.  Musculoskeletal: Negative for arthralgias and back pain.  Skin: Negative for color change and rash.  Neurological: Negative for seizures and syncope.  All other systems reviewed and are negative.   Physical Exam Updated Vital Signs BP 102/84   Pulse 87   Temp 97.9 F (36.6 C) (Oral)   Resp 13   SpO2 96%   Physical Exam Vitals and nursing note reviewed.  Constitutional:      Appearance: He is well-developed.  HENT:     Head: Normocephalic and atraumatic.  Eyes:     Conjunctiva/sclera: Conjunctivae normal.  Cardiovascular:  Rate and Rhythm: Normal rate and regular rhythm.     Heart sounds: No murmur heard.   Pulmonary:     Comments: Mildly tachypneic but not in distress, 4 L nasal cannula, diminished breath sounds at bases Abdominal:     Palpations: Abdomen is soft.     Tenderness: There is no abdominal tenderness.  Musculoskeletal:     Cervical back: Neck supple.     Right lower leg: No edema.     Left lower leg: No edema.  Skin:    General: Skin is warm and dry.  Neurological:     General: No focal deficit present.     Mental Status: He is alert.     ED Results / Procedures / Treatments   Labs (all labs ordered are listed, but only abnormal results are displayed) Labs Reviewed  LACTIC ACID, PLASMA - Abnormal; Notable for the following components:      Result Value   Lactic Acid, Venous 3.5 (*)    All other components within normal limits  COMPREHENSIVE METABOLIC PANEL - Abnormal; Notable for the following components:   Sodium 132 (*)    Potassium 7.3 (*)    Glucose, Bld 159 (*)    BUN 53 (*)    Creatinine, Ser 2.56 (*)    Total Protein 6.0 (*)    Albumin 2.9 (*)    AST 308 (*)    ALT 76 (*)    Total Bilirubin 2.5 (*)    GFR, Estimated 23 (*)    All other components within normal limits  LACTATE DEHYDROGENASE - Abnormal; Notable for the following components:   LDH 1,303 (*)    All  other components within normal limits  C-REACTIVE PROTEIN - Abnormal; Notable for the following components:   CRP 23.8 (*)    All other components within normal limits  BRAIN NATRIURETIC PEPTIDE - Abnormal; Notable for the following components:   B Natriuretic Peptide 1,749.7 (*)    All other components within normal limits  TROPONIN I (HIGH SENSITIVITY) - Abnormal; Notable for the following components:   Troponin I (High Sensitivity) >27,000 (*)    All other components within normal limits  RESPIRATORY PANEL BY RT PCR (FLU A&B, COVID)  CULTURE, BLOOD (ROUTINE X 2)  CULTURE, BLOOD (ROUTINE X 2)  FERRITIN  TRIGLYCERIDES  LACTIC ACID, PLASMA  CBC WITH DIFFERENTIAL/PLATELET  PROCALCITONIN  FIBRINOGEN  D-DIMER, QUANTITATIVE (NOT AT [facility])  TROPONIN I (HIGH SENSITIVITY)    EKG EKG Interpretation  Date/Time:  Friday June 19 2020 13:21:20 EST Ventricular Rate:  85 PR Interval:    QRS Duration: 111 QT Interval:  353 QTC Calculation: 420 R Axis:   -171 Text Interpretation: Uncertain rhythm: review Probable anterolateral infarct, recent no acute STEMI, confirmed with [facility] Confirmed by Madalyn Rob [id number] on 06/19/2020 1:23:53 PM   Radiology DG Chest Port 1 View  Result Date: 06/19/2020 CLINICAL DATA:  Shortness of breath. EXAM: PORTABLE CHEST 1 VIEW COMPARISON:  November 10, 2016. FINDINGS: Mild cardiomegaly is noted with probable central pulmonary vascular congestion. No pneumothorax is noted. Mild bibasilar subsegmental atelectasis is noted. Small left pleural effusion is noted. Degenerative changes are seen involving the right shoulder. IMPRESSION: Mild cardiomegaly with probable central pulmonary vascular congestion. Mild bibasilar subsegmental atelectasis. Small left pleural effusion. Aortic Atherosclerosis (ICD10-I70.0). Electronically Signed   By: Marijo Conception M.D.   On: 06/19/2020 13:54    Procedures .Critical Care Performed by: Lucrezia Starch, MD Authorized  by: Lucrezia Starch,  MD   Critical care provider statement:    Critical care time (minutes):  45   Critical care was necessary to treat or prevent imminent or life-threatening deterioration of the following conditions:  Cardiac failure and respiratory failure   Critical care was time spent personally by me on the following activities:  Discussions with consultants, evaluation of patient's response to treatment, examination of patient, ordering and performing treatments and interventions, ordering and review of laboratory studies, ordering and review of radiographic studies, pulse oximetry, re-evaluation of patient's condition, obtaining history from patient or surrogate and review of old charts   (including critical care time)  Medications Ordered in ED Medications  sodium chloride 0.9 % bolus 500 mL (0 mLs Intravenous Stopped 06/19/20 1537)    ED Course  I have reviewed the triage vital signs and the nursing notes.  Pertinent labs & imaging results that were available during my care of the patient were reviewed by me and considered in my medical decision making (see chart for details).  Clinical Course as of Jun 19 1606  Fri Jun 19, 2020  1534 Signed out to [place]   [RD]    Clinical Course User Index [RD] Lucrezia Starch, MD   MDM Rules/Calculators/A&P                         86 year old male presents to ER with concern for shortness of breath in setting of possible positive Covid test.  On exam patient noted to be significantly hypoxic, requiring 4 to 5 L nasal cannula supplemental oxygen but patient not in frank respiratory distress.  Based on history, reported home test being positive, originally suspected COVID-19 and ordered suspected Covid admission order set.  Initial BP okay but subsequent check was mildly hypotensive, provided small fluid bolus.  Covid test was negative here.  His CXR concerning for possible vascular congestion, proBNP significantly elevated.  No  known history of heart failure however given these findings, concern for fluid overload state causing his respiratory failure.    Of note, there was some question of st elevation on initial ekg and Mcalhany had evaluated patient, given no active CP, recommended no STEMI call but to call back later if needed.   Signed out to Dr. Billy Fischer who will discuss case with cardiology and follow-up on remainder of labs.  Final Clinical Impression(s) / ED Diagnoses Final diagnoses:  Acute respiratory failure with hypoxia (HCC)  Hypervolemia, unspecified hypervolemia type    Rx / DC Orders ED Discharge Orders    None       Lucrezia Starch, MD 06/19/20 [phone]

## 2020-06-19 NOTE — ED Notes (Addendum)
This RN secure Joellyn Quails, MD regaurding pt not meeting criteria for cadiology but pt BP is 76/58. RN suggested levo. MD says that it will be up to critical care.

## 2020-06-19 NOTE — Consult Note (Addendum)
NAME:  [name], MRN:  [id number], DOB:  [birth year], LOS: 0 ADMISSION DATE:  06/19/2020, CONSULTATION DATE:  06/19/2020 REFERRING MD:  Dr. Billy Fischer, CHIEF COMPLAINT:  Respiratory distress   Brief History   86yo male presents to ED with complaint of SOB that began 3 days prior to admission. Tested positive for COVID by home test negative on ED sawb. PCCM asked for assistance in management   History of present illness   [name] is a 86yo male with PMX significant for Stroke, seizures, prostate CA, HTN, and HLD who presented to ED for acute complaint of SOB x 3days. Per chart review it appears patient took a home COVID test that was positive that call to EMS. On arrival to EMS oxygen saturations was 74% on room air. Patient denies any other complaints including no fever, chills, chest pain, or abdominal pain.   On arrival to ED vital signs significant for tachypnea with respiratory rate of 28, all other vitals within normal limits. Labwork notable for NA 132, K 7.3, glucose 159, creatinine 2.56, AST 306, ALT 76, BNP 1,749.7, LDH 1303, HS troponin > 27, 000, CRP 23.8, D-dimer 7.78, and WBC 12.8. CXR with mild cardiomegaly and cental vascular congestion. Due to the concern for NSTEMI and decompensated HF cardiology was called. Subsequently PCCM was also consulted given the concern for underlying COVID and questionable need for repeat testing.   Past Medical History  Stroke Seizures Prostate CA HTN HLD  Significant Hospital Events   Admitted 11/12 for acute SOB  Consults:  Cardiology  PCCM   Procedures:    Significant Diagnostic Tests:  CXR 11/12 < Mild cardiomegaly with probable central pulmonary vascular congestion. Mild bibasilar subsegmental atelectasis. Small left pleural effusion.  ECHO 11/12> LVEF 20-25% with severely decreased function, regional wall motion abnormalities consistent with large LAD infarction. Grade II LV diastolic dysfunction. RV systolic  funtion moderately reduced and RV enlarged. Dilated RA and LA.   Micro Data:  COVID 11/12 > Negative   Antimicrobials:     Interim history/subjective:  Seen by cardiology and started on heparin No distress ECHO with poor LVEF   Objective   Blood pressure 105/74, pulse 88, temperature 97.9 F (36.6 C), temperature source Oral, resp. rate (!) 28, height 5\' 8"  (1.727 m), weight 77.1 kg, SpO2 94 %.        Intake/Output Summary (Last 24 hours) at 06/19/2020 1721 Last data filed at 06/19/2020 1537 Gross per 24 hour  Intake 500 ml  Output --  Net 500 ml   Filed Weights   06/19/20 1605  Weight: 77.1 kg    Examination: General: Elderly, acutely ill appearing M, seated in bed, comfortable appearing and in  NAD HENT: NCAT pink mmm trachea midline.  Lungs: Scattered crackles. Symmetrical chest expansion, even unlabored respirations  Cardiovascular: rrr distant heart sounds, s1s2. No murmur. Cap refill brisk. JVD 7.5cm when flat  Abdomen: soft round ndnt  Extremities: Trace lower extremity edema.  Neuro: AAOx3 following commands.  GU: defer   Resolved Hospital Problem list     Assessment & Plan:   NSTEMI  -trops 27,000, ECG c/w anterior MI Acute systolic and diastolic heart failure -Echo with LVEF 20-25%, grade II diastolic dysfunction, wall motion abnormalities, enlarged RV with moderately reduced function, mildly dilated LA RA.  -currently without shock, but BP somewhat soft P -cardiology following -heparin gtt, ASA palvix  -at present, stable for SDU admission-- recommend 2c -if decompensates, low threshold for ICU transfer and NE initiation.  Acute hypoxic respiratory failure -pulmonary edema  -elevated BNP, CXR looks like central venous congestion, edema -Covid 19 PCR in Ed is negative. I do not feel compelled to recheck this Elevated Ddimer P -change [place] to HFNC -SpO2 goal > 92% -eventually likely will diuresis, but would probably defer given soft  Bps -consider VQ scan   AKI Hyperkalemia - improved P -serial renal indices -- I see AM labs are ordered, I will order a follow up BMP for tonight  -correct electrolyte abnormalities as able     Thank you for consulting PCCM. At this time we will sign off. Please re-engage if we can be of further assistance or if patient's clinical status changes.    Best practice:  Diet: npo  Pain/Anxiety/Delirium protocol (if indicated): na VAP protocol (if indicated): na DVT prophylaxis: hep gtt  GI prophylaxis: na Glucose control: monitor Mobility: BR Code Status: Prior has been full code. Recommend having more detailed discussions about this.  Family Communication: per primary  Disposition: SDU  Labs   CBC: No results for input(s): WBC, NEUTROABS, HGB, HCT, MCV, PLT in the last 168 hours.  Basic Metabolic Panel: Recent Labs  Lab 06/19/20 1404  NA 132*  K 7.3*  CL 98  CO2 22  GLUCOSE 159*  BUN 53*  CREATININE 2.56*  CALCIUM 9.5   GFR: Estimated Creatinine Clearance: 18.9 mL/min (A) (by C-G formula based on SCr of 2.56 mg/dL (H)). Recent Labs  Lab 06/19/20 1404  LATICACIDVEN 3.5*    Liver Function Tests: Recent Labs  Lab 06/19/20 1404  AST 308*  ALT 76*  ALKPHOS 65  BILITOT 2.5*  PROT 6.0*  ALBUMIN 2.9*   No results for input(s): LIPASE, AMYLASE in the last 168 hours. No results for input(s): AMMONIA in the last 168 hours.  ABG    Component Value Date/Time   TCO2 30 09/16/2016 1506     Coagulation Profile: No results for input(s): INR, PROTIME in the last 168 hours.  Cardiac Enzymes: No results for input(s): CKTOTAL, CKMB, CKMBINDEX, TROPONINI in the last 168 hours.  HbA1C: Hgb A1c MFr Bld  Date/Time Value Ref Range Status  09/17/2016 10:52 AM 5.7 (H) 4.8 - 5.6 % Final    Comment:    (NOTE)         Pre-diabetes: 5.7 - 6.4         Diabetes: >6.4         Glycemic control for adults with diabetes: <7.0   07/13/2013 05:15 AM 5.8 (H) <5.7 % Final     Comment:    (NOTE)                                                                       According to the ADA Clinical Practice Recommendations for 2011, when HbA1c is used as a screening test:  >=6.5%   Diagnostic of Diabetes Mellitus           (if abnormal result is confirmed) 5.7-6.4%   Increased risk of developing Diabetes Mellitus References:Diagnosis and Classification of Diabetes Mellitus,Diabetes XKGY,1856,31(SHFWY 1):S62-S69 and Standards of Medical Care in         Diabetes - 2011,Diabetes OVZC,5885,02 (Suppl 1):S11-S61.    CBG: No results for input(s): GLUCAP in  the last 168 hours.  Review of Systems:   + nausea + vomiting + SOB. + DOE + intermittent lower extremity edema - chest pain - palpitations  - HA - LOC - falls  - changes to hair skin nails  + dark urine, decreased UOP   Past Medical History  He,  has a past medical history of Abnormality of gait (09/15/2015), Cancer ([place]), Dyslipidemia, Foot drop, left (09/15/2015), Hemiparesis, aphasia, and dysphagia as late effects of stroke ([place]) (09/15/2015), High cholesterol, History of cerebrovascular disease, Hypertension, Prostate cancer ([place]), Seizures ([place]), Stroke ([place]), and TIA (transient ischemic attack).   Surgical History    Past Surgical History:  Procedure Laterality Date  . ABDOMINAL HERNIA REPAIR    . CAROTID ENDARTERECTOMY     Bilateral  . CATARACT EXTRACTION Bilateral   . Radium seed implants     Prostate cancer  . TONSILLECTOMY       Social History   reports that he has never smoked. He has never used smokeless tobacco. He reports that he does not drink alcohol and does not use drugs.   Family History   His family history includes Alcoholism in his father; Diabetes in his brother, daughter, and sister; Heart disease in his brother, brother, and brother; Heart failure in his brother and mother.   Allergies Allergies  Allergen Reactions  . Ativan [Lorazepam] Other (See Comments)    Mood  change/aggressiveness  . Tape Other (See Comments)    SKIN IS VERY THIN AND WILL TEAR AND BRUISE EASILY!!     Home Medications  Prior to Admission medications   Medication Sig Start Date End Date Taking? Authorizing Provider  acetaminophen (TYLENOL) 325 MG tablet Take 325-650 mg by mouth every 6 (six) hours as needed for mild pain (or headaches).    Yes [provider]  aspirin EC 81 MG tablet Take 81 mg by mouth daily.   Yes [provider]  Biotin 1000 MCG tablet Take 1,000 mcg by mouth daily.   Yes [provider]  Cholecalciferol (VITAMIN D-3) 25 MCG (1000 UT) CAPS Take 1,000 Units by mouth daily.   Yes [provider]  clopidogrel (PLAVIX) 75 MG tablet Take 75 mg by mouth daily.    Yes [provider]  Dextromethorphan-guaiFENesin (MUCINEX DM) 30-600 MG TB12 Take 0.5 tablets by mouth in the morning.   Yes [provider]  fluticasone (FLONASE) 50 MCG/ACT nasal spray Place 1 spray into both nostrils daily as needed for allergies or rhinitis.   Yes [provider]  levETIRAcetam (KEPPRA) 500 MG tablet Take 1,000 mg by mouth 2 (two) times daily.   Yes [provider]  levofloxacin (LEVAQUIN) 500 MG tablet Take 500 mg by mouth daily. FOR 7 DAYS July 10, 2020  Yes [provider]  LEVOXYL 25 MCG tablet Take 12.5 mcg by mouth daily before breakfast.   Yes [provider]  loratadine (CLARITIN) 10 MG tablet Take 10 mg by mouth daily as needed for allergies or rhinitis.   Yes [provider]  losartan (COZAAR) 50 MG tablet Take 25 mg by mouth daily.    Yes [provider]  Misc Natural Products (ELDERBERRY ZINC/VIT C/IMMUNE) LOZG Use as directed 1 lozenge in the mouth or throat daily.   Yes [provider]  Multiple Vitamin (MULTIVITAMIN WITH MINERALS) TABS tablet Take 1 tablet by mouth daily. [facility] Senior   Yes [provider]  pantoprazole (PROTONIX) 40 MG tablet Take 40 mg  by mouth daily  before breakfast.    Yes [provider]  pravastatin (PRAVACHOL) 40 MG tablet Take 40 mg by mouth daily.   Yes [provider]  predniSONE (DELTASONE) 10 MG tablet Take 10-60 mg by mouth See admin instructions. Take 60 mg by mouth in the morning with breakfast and decrease by 10 mg daily until finished 06/25/20  Yes [provider]  promethazine (PHENERGAN) 12.5 MG tablet Take 12.5-25 mg by mouth every 12 (twelve) hours as needed for nausea or vomiting.   Yes [provider]  Tart Cherry 1200 MG CAPS Take 2,400 mg by mouth daily.   Yes [provider]  vitamin B-12 (CYANOCOBALAMIN) 500 MCG tablet Take 500 mcg by mouth daily.   Yes [provider]  vitamin C (ASCORBIC ACID) 250 MG tablet Take 250 mg by mouth daily.   Yes [provider]  baclofen (LIORESAL) 10 MG tablet Take 0.5 tablets (5 mg total) by mouth 3 (three) times daily. Patient not taking: Reported on 06/19/2020 09/15/15   Kathrynn Ducking, MD  finasteride (PROSCAR) 5 MG tablet Take 5 mg by mouth daily.     [provider]  levETIRAcetam (KEPPRA) 1000 MG tablet Take 1 tablet (1,000 mg total) by mouth 2 (two) times daily. Patient not taking: Reported on 06/19/2020 04/24/18   Kathrynn Ducking, MD    Eliseo Gum MSN, AGACNP-BC [place] [phone] If no answer, [phone] 06/19/2020, 6:33 PM

## 2020-06-19 NOTE — Progress Notes (Signed)
ANTICOAGULATION CONSULT NOTE - Initial Consult  Pharmacy Consult for heparin Indication: chest pain/ACS  Allergies  Allergen Reactions  . Ativan [Lorazepam] Other (See Comments)    Mood change/aggressiveness  . Tape Other (See Comments)    SKIN IS VERY THIN AND WILL TEAR AND BRUISE EASILY!!    Patient Measurements: Height: 5\' 8"  (172.7 cm) Weight: 77.1 kg (170 lb) IBW/kg (Calculated) : 68.4 Heparin Dosing Weight: 77.1kg  Vital Signs: Temp: 97.9 F (36.6 C) (11/12 1321) Temp Source: Oral (11/12 1321) BP: 102/84 (11/12 1605) Pulse Rate: 87 (11/12 1605)  Labs: Recent Labs    06/19/20 1404  CREATININE 2.56*  TROPONINIHS >27,000*    Estimated Creatinine Clearance: 18.9 mL/min (A) (by C-G formula based on SCr of 2.56 mg/dL (H)).   Medical History: Past Medical History:  Diagnosis Date  . Abnormality of gait 09/15/2015  . Cancer ([place])   . Dyslipidemia   . Foot drop, left 09/15/2015  . Hemiparesis, aphasia, and dysphagia as late effects of stroke ([place]) 09/15/2015  . High cholesterol   . History of cerebrovascular disease    Right parietal, left cerebellar strokes, chronic  . Hypertension   . Prostate cancer ([place])    Radium seed implants  . Seizures ([place])    05/2013  . Stroke ([place])    04/20/1998  . TIA (transient ischemic attack)     Medications:  Infusions:  . heparin 1,000 Units/hr (06/19/20 1724)    Assessment: 79 yom presented to the ED with SOB. Troponin elevated and now starting IV heparin. Baseline CBC is pending but has been normal in the past. He is not on anticoagulation PTA.   Goal of Therapy:  Heparin level 0.3-0.7 units/ml Monitor platelets by anticoagulation protocol: Yes   Plan:  Heparin bolus 4000 units IV x 1 Heparin gtt 1000 units/hr Check an 8 hr heparin level Daily heparin level and CBC  Tonatiuh Mallon, Rande Lawman 06/19/2020,4:58 PM

## 2020-06-19 NOTE — Progress Notes (Signed)
  Echocardiogram 2D Echocardiogram has been performed.  [name] 06/19/2020, 5:03 PM

## 2020-06-19 NOTE — Consult Note (Addendum)
Cardiology Consultation:   Patient ID: [name] MRN: [id number]; DOB: April 12, 1929  Admit date: 06/19/2020 Date of Consult: 06/19/2020  Primary Care Provider: Nicholos Johns, MD [facility] Cardiologist: New    Patient Profile:   [name] is a 86 y.o. male with a hx of CVA/TIA, hypertension and hyperlipidemia who is being seen today for the evaluation of elevated troponin at the request of Dr. Billy Fischer.   History of Present Illness:   [name] currently on bag mass and provide a limited history.  Mostly with history obtained from caregiver [phone]) and reviewing chart.  Per caregiver, patient had sudden onset shortness of breath 2 days ago and it progressively got worse.  Also reported nausea.  PCP prescribed Phenergan without improvement.  Due to worsening breathing issue he got home Covid test which come back positive and EMS was called.  He was found hypoxic at oxygen saturation of 74% by EMS and brought to ER.  His Covid test is negative here.  Influenza negative. Creatinine 2.56 Potassium 7.3 BUN 53 AST 308 ALT 76 Albumin 2.9 High-sensitivity troponin greater than 27,000 BNP 1749 LDH 1303 Lactic acid 3.5 CRP 23.8 Pending blood culture Chest x-ray with pulmonary vascular congestion Pending echocardiogram  Blood pressure soft.  Given 500 cc of normal saline bolus.  Due to hyperkalemia he was given calcium gluconate, IV dextrose and insulin.  Per caregiver patient recently dealing with dysphagia and had swallow study.  Unknown result.  Patient and caregiver denied any episode of chest pain or palpitation.  Poor intake any only one time urination in past 2 days. No prior cardiac history.  Per caregiver, patient not taking his antiplatelet therapy.  He only takes his Keppra and Protonix.  He has been noncompliant with his medication.  He did not take his blood pressure medication at home as well.   Past Medical History:  Diagnosis Date  .  Abnormality of gait 09/15/2015  . Cancer ([place])   . Dyslipidemia   . Foot drop, left 09/15/2015  . Hemiparesis, aphasia, and dysphagia as late effects of stroke ([place]) 09/15/2015  . High cholesterol   . History of cerebrovascular disease    Right parietal, left cerebellar strokes, chronic  . Hypertension   . Prostate cancer ([place])    Radium seed implants  . Seizures ([place])    05/2013  . Stroke ([place])    04/20/1998  . TIA (transient ischemic attack)     Past Surgical History:  Procedure Laterality Date  . ABDOMINAL HERNIA REPAIR    . CAROTID ENDARTERECTOMY     Bilateral  . CATARACT EXTRACTION Bilateral   . Radium seed implants     Prostate cancer  . TONSILLECTOMY       Inpatient Medications: Scheduled Meds:  Continuous Infusions: . calcium gluconate 1,000 mg (06/19/20 1622)   PRN Meds:   Allergies:    Allergies  Allergen Reactions  . Ativan [Lorazepam] Other (See Comments)    Mood change/aggressiveness  . Tape Other (See Comments)    SKIN IS VERY THIN AND WILL TEAR AND BRUISE EASILY!!    Social History:   Social History   Socioeconomic History  . Marital status: Married    Spouse name: Not on file  . Number of children: 2    . Years of education: college 2  . Highest education level: Not on file  Occupational History  . Occupation: Retired  Tobacco Use  . Smoking status: Never Smoker  . Smokeless tobacco: Never Used  Substance and Sexual Activity  . Alcohol use: No  . Drug use: No  . Sexual activity: Not on file  Other Topics Concern  . Not on file  Social History Narrative   Married, 2 children 1 deceased   Right handed   2 yr college   occ tea   Social Determinants of Health   Financial Resource Strain:   . Difficulty of Paying Living Expenses: Not on file  Food Insecurity:   . Worried About Charity fundraiser in the Last Year: Not on file  . Ran Out of Food in the Last Year: Not on file  Transportation Needs:   . Lack of Transportation  (Medical): Not on file  . Lack of Transportation (Non-Medical): Not on file  Physical Activity:   . Days of Exercise per Week: Not on file  . Minutes of Exercise per Session: Not on file  Stress:   . Feeling of Stress : Not on file  Social Connections:   . Frequency of Communication with Friends and Family: Not on file  . Frequency of Social Gatherings with Friends and Family: Not on file  . Attends Religious Services: Not on file  . Active Member of Clubs or Organizations: Not on file  . Attends Archivist Meetings: Not on file  . Marital Status: Not on file  Intimate Partner Violence:   . Fear of Current or Ex-Partner: Not on file  . Emotionally Abused: Not on file  . Physically Abused: Not on file  . Sexually Abused: Not on file    Family History:    Family History  Problem Relation Age of Onset  . Heart failure Brother   . Heart disease Brother   . Heart failure Mother   . Alcoholism Father   . Diabetes Daughter   . Diabetes Brother   . Heart disease Brother   . Heart disease Brother   . Diabetes Sister      ROS:  Please see the history of present illness.  All other ROS reviewed and negative.     Physical Exam/Data:   Vitals:   06/19/20 1508 06/19/20 1510 06/19/20 1530 06/19/20 1605  BP:  92/75 104/77 102/84  Pulse: 84 84 88 87  Resp: (!) 31 20 (!) 32 13  Temp:      TempSrc:      SpO2: 90% (!) 89% (!) 89% 96%    Intake/Output Summary (Last 24 hours) at 06/19/2020 1647 Last data filed at 06/19/2020 1537 Gross per 24 hour  Intake 500 ml  Output --  Net 500 ml   Last 3 Weights 06/03/2018 01/18/2017 11/02/2016  Weight (lbs) 170 lb 3 oz 163 lb 169 lb 8 oz  Weight (kg) 77.197 kg 73.936 kg 76.885 kg     There is no height or weight on file to calculate BMI.  General: Ill appearing  in no acute distress HEENT: normal Lymph: no adenopathy Neck: no JVD Endocrine:  No thryomegaly Vascular: No carotid bruits; FA pulses 2+ bilaterally without bruits   Cardiac:  normal S1, S2; RRR; no murmur  Lungs: Diminished breath sound with rales Abd: soft, nontender, no hepatomegaly  Ext: no edema Musculoskeletal:  No deformities, BUE and BLE strength normal and equal Skin: warm and dry  Neuro:  CNs 2-12 intact, no focal abnormalities noted Psych:  Normal affect   EKG:  The EKG was personally reviewed and demonstrates: Undetermined rhythm with concerning for ST elevation   Relevant CV Studies: Pending  echocardiogram  Laboratory Data:  High Sensitivity Troponin:   Recent Labs  Lab 06/19/20 1404  TROPONINIHS >27,000*     Chemistry Recent Labs  Lab 06/19/20 1404  NA 132*  K 7.3*  CL 98  CO2 22  GLUCOSE 159*  BUN 53*  CREATININE 2.56*  CALCIUM 9.5  GFRNONAA 23*  ANIONGAP 12    Recent Labs  Lab 06/19/20 1404  PROT 6.0*  ALBUMIN 2.9*  AST 308*  ALT 76*  ALKPHOS 65  BILITOT 2.5*   HematologyNo results for input(s): WBC, RBC, HGB, HCT, MCV, MCH, MCHC, RDW, PLT in the last 168 hours. BNP Recent Labs  Lab 06/19/20 1404  BNP 1,749.7*     Radiology/Studies:  DG Chest Port 1 View  Result Date: 06/19/2020 CLINICAL DATA:  Shortness of breath. EXAM: PORTABLE CHEST 1 VIEW COMPARISON:  November 10, 2016. FINDINGS: Mild cardiomegaly is noted with probable central pulmonary vascular congestion. No pneumothorax is noted. Mild bibasilar subsegmental atelectasis is noted. Small left pleural effusion is noted. Degenerative changes are seen involving the right shoulder. IMPRESSION: Mild cardiomegaly with probable central pulmonary vascular congestion. Mild bibasilar subsegmental atelectasis. Small left pleural effusion. Aortic Atherosclerosis (ICD10-I70.0). Electronically Signed   By: Marijo Conception M.D.   On: 06/19/2020 66:37   86 year old male Assessment and Plan:   1.  NSTEMI 2.  Acute hypoxic respiratory failure -Patient with sudden onset shortness of breath 2 days ago which has progressively got worsened.  Associated with nausea  and vomiting.  Poor p.o. intake and urination.  Has progressive worsening shortness of breath and found hypoxic.  Patient never had any chest pain or palpitation.  EKG with low voltage and anterior Q waves. -His symptoms and review of labs concerning for completed MI.  However, he never had any chest pain and other lab concerning for pulmonary embolism versus Covid.  Despite negative Covid test here, recommending another test.  Internal medicine admission. -Start anticoagulation with heparin -Start aspirin 81 mg daily - Pending echo   3.  Hyperkalemia -Patient was given calcium gluconate, dextrose and insulin -Pending repeat lab  4.  AKI -With poor intake -Follow closely  5.  Elevated LFTs -Questionable due to completed MI -Follow with serial lab -With adding statin for now  TIMI Risk Score for Unstable Angina or Non-ST Elevation MI:   The patient's TIMI risk score is 2, which indicates a 8% risk of all cause mortality, new or recurrent myocardial infarction or need for urgent revascularization in the next 14 days.   For questions or updates, please contact [place] Please consult [email or web address] for contact info under    Jarrett Soho, [place]  06/19/2020 4:47 PM   Patient seen and examined. Agree with assessment and plan.  [name] is an 86 year old gentleman who lives with his wife at home and has a caregiver.  He has remote history of prior CVA/TIA for which she has been on chronic aspirin/Plavix and has a history of hypertension and hyperlipidemia.  He has no known CAD.  The patient denies any chest pain.  Apparently there is a history of sudden shortness of breath several days ago which has progressed.  However he was not consistent in giving his history.  He apparently had a home Covid test today which came back borderline positive EMS was called.  He was found to be hypoxic with reduced O2 sat at 75% was started on supplemental oxygen and brought to the  emergency room.  In the ER  PCR Covid test is negative.  He denies chest pain.  Laboratory was notable for increasing creatinine up to 56, potassium 7.3, BUN 53, AST 308 with ALT at 76, BNP 1749, high-sensitivity troponin greater than 27,000, lactic acid 3.5, and chest x-ray suggest pulmonary vascular congestion.  He is adamant that he does not have any chest pain have not had chest pain several days ago.  On exam he appears comfortable with this supplemental oxygen mask in place.  Blood pressure was 102/84.  Once supplemental oxygen, PO2 now in the 90% or slightly greater.  JVD measured 8 cm.  There were no rales or wheezes.  Rhythm was regular and not tachycardic with a pulse in the 80s.  Abdomen was soft.  There was no definitive hepatojugular reflux.  Bowel sounds are present.  Pulses were 2+.  Neurologic exam was grossly nonfocal.  He had a normal affect.  His ECG shows low voltage with sinus rhythm at 85, QS complex V1 through V5, 1 and aVL with residual 1 to 1.5 mm ST elevation in precordial leads.  QTc interval 420 ms.  ECG's suggested completed anterior MI which is concordant with his greater than 27,000 troponin on presentation.  I suspect he may have suffered myocardial infarction several days ago contributing to congestion, increasing shortness of breath, reduced forward cardiac output with AKI (do not know baseline renal function).  His hyperkalemia has been treated with insulin, glucose, and calcium gluconate and follow-up potassium has improved to 4.8 with follow-up creatinine 2.36.  Elevated BNP at 1749 consistent with probable acute systolic heart failure from recent MI.  D-dimer is also positive which may be secondary to MI, consider VQ scan.  Recommend systemic heparinization.  Elevated LFTs most likely are contributed by his myocardial infarction and hepatic congestion.  A stat 2D echo Doppler study has been ordered to assess LV function which I suspect is significantly reduced.    Hospitalist  service will also be admitting patient.  Recommend 2H or 2C stepdown if no beds depending upon bed availability for close observation.  If LV function is depressed, and hypotension develops, inotropic support with Levophed may be needed.   Troy Sine, MD, [facility] 06/19/2020 5:22 PM

## 2020-06-19 NOTE — ED Notes (Signed)
Micheal Pronto, MD states to hold off on pt going to [facility]. MD thinks pt needs ICU placement due to SPB <90.

## 2020-06-19 NOTE — ED Provider Notes (Signed)
  Physical Exam  BP (!) 91/54   Pulse 93   Temp 97.9 F (36.6 C) (Oral)   Resp (!) 21   Ht 5\' 8"  (1.727 m)   Wt 77.1 kg   SpO2 91%   BMI 25.85 kg/m   Physical Exam  ED Course/Procedures   Clinical Course as of Jun 19 1800  Fri Jun 19, 2020  1534 Signed out to [place]   [RD]    Clinical Course User Index [RD] Lucrezia Starch, MD    .Critical Care Performed by: Gareth Morgan, MD Authorized by: Gareth Morgan, MD   Critical care provider statement:    Critical care time (minutes):  45   Critical care was time spent personally by me on the following activities:  Discussions with consultants, evaluation of patient's response to treatment, examination of patient, ordering and performing treatments and interventions, ordering and review of laboratory studies, ordering and review of radiographic studies, pulse oximetry, re-evaluation of patient's condition, obtaining history from patient or surrogate and review of old charts    MDM    Received care of patient from Dr. Roslynn Amble.  Please see his note for prior history, physical and care.  Briefly this is a 86 year old male who presented when his caregiver noted he was short of breath, was found to be hypoxic down to the 70s with blood pressures in the 21J systolic.  There was report of a home Covid test that was faintly positive, and initial thought that symptoms is secondary to Covid.  Dr. Angelena Form had reviewed his EKG on arrival with concern for possible ST elevation MI, and felt that given clinical picture and EKGs he did not meet STEMI criteria.  Of note, patient denies symptoms including no chest pain or dyspnea.    Covid test returned here with a negative PCR.  Chest x-ray showed vascular congestion and cardiomegaly.  Labs are significant for BNP greater than 1800, and troponin of greater than 27,000.  Consulted cardiology again regarding concern for cardiogenic shock, hypotension, and hypoxic respiratory failure.  He  was noted to have oxygen saturations down to 85 on 6 L of nasal cannula and was placed on nonrebreather.  Placed STAT ECHO order. Cardiology came to the bedside, and at this time they are recommending a medicine admission.  Called critical care given concern for patient with need for diuresis with hypoxic respiratory failure and hypotension preventing this and PCCM came to bedside and at this time feel he is appropriate for 2C.  He is maintaining his saturation, BP stable in 90s.  Consulted Hospitalist for admission. Updated family over the phone. Pt placed on aspirin and heparin gtt. Reports he is DNR but would consider short period of intubation.        Gareth Morgan, MD 06/20/20 [phone]

## 2020-06-20 ENCOUNTER — Other Ambulatory Visit (HOSPITAL_COMMUNITY): Payer: Medicare Other

## 2020-06-20 ENCOUNTER — Inpatient Hospital Stay (HOSPITAL_COMMUNITY): Payer: Medicare Other

## 2020-06-20 DIAGNOSIS — I5041 Acute combined systolic (congestive) and diastolic (congestive) heart failure: Secondary | ICD-10-CM | POA: Diagnosis not present

## 2020-06-20 DIAGNOSIS — Z515 Encounter for palliative care: Secondary | ICD-10-CM

## 2020-06-20 DIAGNOSIS — R570 Cardiogenic shock: Secondary | ICD-10-CM | POA: Diagnosis not present

## 2020-06-20 DIAGNOSIS — I214 Non-ST elevation (NSTEMI) myocardial infarction: Secondary | ICD-10-CM | POA: Diagnosis not present

## 2020-06-20 DIAGNOSIS — J9601 Acute respiratory failure with hypoxia: Secondary | ICD-10-CM | POA: Diagnosis not present

## 2020-06-20 DIAGNOSIS — N179 Acute kidney failure, unspecified: Secondary | ICD-10-CM | POA: Diagnosis not present

## 2020-06-20 DIAGNOSIS — Z7189 Other specified counseling: Secondary | ICD-10-CM

## 2020-06-20 LAB — CBC
HCT: 42.5 % (ref 39.0–52.0)
Hemoglobin: 14 g/dL (ref 13.0–17.0)
MCH: 32.8 pg (ref 26.0–34.0)
MCHC: 32.9 g/dL (ref 30.0–36.0)
MCV: 99.5 fL (ref 80.0–100.0)
Platelets: 110 10*3/uL — ABNORMAL LOW (ref 150–400)
RBC: 4.27 MIL/uL (ref 4.22–5.81)
RDW: 12.7 % (ref 11.5–15.5)
WBC: 25 10*3/uL — ABNORMAL HIGH (ref 4.0–10.5)
nRBC: 0.3 % — ABNORMAL HIGH (ref 0.0–0.2)

## 2020-06-20 LAB — MAGNESIUM: Magnesium: 1.9 mg/dL (ref 1.7–2.4)

## 2020-06-20 LAB — LIPID PANEL
Cholesterol: 118 mg/dL (ref 0–200)
HDL: 51 mg/dL (ref >40)
LDL Cholesterol: 52 mg/dL (ref 0–99)
Total CHOL/HDL Ratio: 2.3 RATIO
Triglycerides: 76 mg/dL (ref <150)
VLDL: 15 mg/dL (ref 0–40)

## 2020-06-20 LAB — PHOSPHORUS: Phosphorus: 2.9 mg/dL (ref 2.5–4.6)

## 2020-06-20 LAB — MRSA PCR SCREENING: MRSA by PCR: NEGATIVE

## 2020-06-20 LAB — HEPARIN LEVEL (UNFRACTIONATED)
Heparin Unfractionated: 0.65 IU/mL (ref 0.30–0.70)
Heparin Unfractionated: 0.75 IU/mL — ABNORMAL HIGH (ref 0.30–0.70)
Heparin Unfractionated: 0.36 IU/mL (ref 0.30–0.70)

## 2020-06-20 LAB — HEPATIC FUNCTION PANEL
ALT: 76 U/L — ABNORMAL HIGH (ref 0–44)
AST: 186 U/L — ABNORMAL HIGH (ref 15–41)
Albumin: 2.7 g/dL — ABNORMAL LOW (ref 3.5–5.0)
Alkaline Phosphatase: 69 U/L (ref 38–126)
Bilirubin, Direct: 0.3 mg/dL — ABNORMAL HIGH (ref 0.0–0.2)
Indirect Bilirubin: 0.7 mg/dL (ref 0.3–0.9)
Total Bilirubin: 1 mg/dL (ref 0.3–1.2)
Total Protein: 6.2 g/dL — ABNORMAL LOW (ref 6.5–8.1)

## 2020-06-20 LAB — GLUCOSE, CAPILLARY
Glucose-Capillary: 146 mg/dL — ABNORMAL HIGH (ref 70–99)
Glucose-Capillary: 148 mg/dL — ABNORMAL HIGH (ref 70–99)
Glucose-Capillary: 152 mg/dL — ABNORMAL HIGH (ref 70–99)
Glucose-Capillary: 158 mg/dL — ABNORMAL HIGH (ref 70–99)

## 2020-06-20 LAB — BASIC METABOLIC PANEL
Anion gap: 14 (ref 5–15)
BUN: 58 mg/dL — ABNORMAL HIGH (ref 8–23)
CO2: 18 mmol/L — ABNORMAL LOW (ref 22–32)
Calcium: 9.8 mg/dL (ref 8.9–10.3)
Chloride: 100 mmol/L (ref 98–111)
Creatinine, Ser: 2.02 mg/dL — ABNORMAL HIGH (ref 0.61–1.24)
GFR, Estimated: 31 mL/min — ABNORMAL LOW (ref >60)
Glucose, Bld: 144 mg/dL — ABNORMAL HIGH (ref 70–99)
Potassium: 5.3 mmol/L — ABNORMAL HIGH (ref 3.5–5.1)
Sodium: 132 mmol/L — ABNORMAL LOW (ref 135–145)

## 2020-06-20 MED ORDER — ORAL CARE MOUTH RINSE
15.0000 mL | Freq: Two times a day (BID) | OROMUCOSAL | Status: DC
  Administered 2020-06-20 – 2020-06-21 (×3): 15 mL via OROMUCOSAL

## 2020-06-20 MED ORDER — POLYETHYLENE GLYCOL 3350 17 G PO PACK: 17.0000 g | PACK | Freq: Every day | ORAL | Status: DC | PRN

## 2020-06-20 MED ORDER — DOCUSATE SODIUM 100 MG PO CAPS: 100.0000 mg | ORAL_CAPSULE | Freq: Two times a day (BID) | ORAL | Status: DC | PRN

## 2020-06-20 MED ORDER — CHLORHEXIDINE GLUCONATE CLOTH 2 % EX PADS
6.0000 | MEDICATED_PAD | Freq: Every day | CUTANEOUS | Status: DC
  Administered 2020-06-20 – 2020-06-21 (×2): 6 via TOPICAL

## 2020-06-20 MED ORDER — NOREPINEPHRINE 4 MG/250ML-% IV SOLN
0.0000 ug/min | INTRAVENOUS | Status: DC
  Administered 2020-06-20: 2 ug/min via INTRAVENOUS
  Administered 2020-06-20: 8 ug/min via INTRAVENOUS
  Administered 2020-06-21: 15 ug/min via INTRAVENOUS
  Administered 2020-06-21: 8 ug/min via INTRAVENOUS
  Filled 2020-06-20 (×6): qty 250

## 2020-06-20 MED ORDER — ENOXAPARIN SODIUM 30 MG/0.3ML ~~LOC~~ SOLN: 30.0000 mg | SUBCUTANEOUS | Status: DC

## 2020-06-20 NOTE — Progress Notes (Signed)
ANTICOAGULATION CONSULT NOTE - Follow Up Consult  Pharmacy Consult for heparin Indication: NSTEMI  Labs: Recent Labs    06/19/20 1404 06/19/20 1525 06/19/20 1618 06/20/20 0248  HEPARINUNFRC  --   --   --  0.65  CREATININE 2.56*  --  2.36*  --   TROPONINIHS >27,000* >27,000*  --   --     Assessment/Plan:  86yo male therapeutic on heparin with initial dosing for NSTEMI. Will continue gtt at current rate and confirm stable with additional level.   Wynona Neat, PharmD, BCPS  06/20/2020,4:01 AM

## 2020-06-20 NOTE — Progress Notes (Signed)
Request to talk with the family and the pt.  Discussed the situation with the pt, pts daughter and son in law.  Had a discussion about the severity of the heart dysfunction and the worsening renal function.  Discussed options of care again.  The pt wants to be a full code and wants vasoactive support if indicated.  He and the family are aware of the severity of the situation since the underlying cause can not be fixed.  Will transfer to the ICU at this time.  Add levophed for MAP<60.

## 2020-06-20 NOTE — Progress Notes (Signed)
ANTICOAGULATION CONSULT NOTE - Initial Consult  Pharmacy Consult for heparin Indication: chest pain/ACS  Allergies  Allergen Reactions  . Ativan [Lorazepam] Other (See Comments)    Mood change/aggressiveness  . Tape Other (See Comments)    SKIN IS VERY THIN AND WILL TEAR AND BRUISE EASILY!!    Patient Measurements: Height: 5\' 8"  (172.7 cm) Weight: 74 kg (163 lb 2.3 oz) IBW/kg (Calculated) : 68.4 Heparin Dosing Weight: 77.1kg  Vital Signs: Temp: 96.9 F (36.1 C) (11/13 0700) Temp Source: Axillary (11/13 0700) BP: 105/66 (11/13 1015) Pulse Rate: 71 (11/13 1015)  Labs: Recent Labs    06/19/20 1404 06/19/20 1525 06/19/20 1618 06/20/20 0248 06/20/20 0817  HGB  --   --   --  14.0  --   HCT  --   --   --  42.5  --   PLT  --   --   --  110*  --   HEPARINUNFRC  --   --   --  0.65 0.75*  CREATININE 2.56*  --  2.36* 2.02*  --   TROPONINIHS >27,000* >27,000*  --   --   --     Estimated Creatinine Clearance: 24 mL/min (A) (by C-G formula based on SCr of 2.02 mg/dL (H)).   Medical History: Past Medical History:  Diagnosis Date  . Abnormality of gait 09/15/2015  . Cancer ([place])   . Dyslipidemia   . Foot drop, left 09/15/2015  . Hemiparesis, aphasia, and dysphagia as late effects of stroke ([place]) 09/15/2015  . High cholesterol   . History of cerebrovascular disease    Right parietal, left cerebellar strokes, chronic  . Hypertension   . Prostate cancer ([place])    Radium seed implants  . Seizures ([place])    05/2013  . Stroke ([place])    04/20/1998  . TIA (transient ischemic attack)     Medications:  Infusions:  . heparin 1,000 Units/hr (06/20/20 1000)  . norepinephrine (LEVOPHED) Adult infusion 7 mcg/min (06/20/20 1000)    Assessment: 79 yom presented to the ED with SOB. Troponin elevated and now starting IV heparin. Baseline CBC is pending but has been normal in the past. He is not on anticoagulation PTA.   Confirmatory HL high at 0.75.  Goal of Therapy:  Heparin level  0.3-0.7 units/ml Monitor platelets by anticoagulation protocol: Yes   Plan:  Decr Heparin gtt 900 units/hr Check an 8 hr heparin level Daily heparin level and CBC  Alanda Slim, PharmD, [facility] Clinical Pharmacist Please see AMION for all Pharmacists' Contact Phone Numbers 06/20/2020, 10:46 AM

## 2020-06-20 NOTE — Progress Notes (Signed)
Progress Note   Subjective   Doing well today, the patient denies CP.  SOB is a little better.  No new concerns  Inpatient Medications    Scheduled Meds: . aspirin  81 mg Oral Daily  . Chlorhexidine Gluconate Cloth  6 each Topical Daily  . clopidogrel  75 mg Oral Daily  . levETIRAcetam  500 mg Oral BID  . levothyroxine  12.5 mcg Oral QAC breakfast  . mouth rinse  15 mL Mouth Rinse BID  . sodium chloride flush  3 mL Intravenous Q12H   Continuous Infusions: . heparin 1,000 Units/hr (06/20/20 1000)  . norepinephrine (LEVOPHED) Adult infusion 7 mcg/min (06/20/20 1000)   PRN Meds: acetaminophen **OR** acetaminophen, docusate sodium, polyethylene glycol   Vital Signs    Vitals:   06/20/20 0945 06/20/20 1000 06/20/20 1015 06/20/20 1100  BP: 90/67 (!) 62/38 105/66   Pulse: 72 73 71 75  Resp: 18 19 17 19   Temp:    97.9 F (36.6 C)  TempSrc:    Axillary  SpO2: 97% 93% 97%   Weight:      Height:        Intake/Output Summary (Last 24 hours) at 06/20/2020 1309 Last data filed at 06/20/2020 1000 Gross per 24 hour  Intake 781.18 ml  Output --  Net 781.18 ml   Filed Weights   06/19/20 1605 06/20/20 0500  Weight: 77.1 kg 74 kg    Telemetry    sinus - Personally Reviewed  Physical Exam   GEN- The patient is elderly and frail appearing, alert and oriented x 3 today.   Head- normocephalic, atraumatic Eyes-  Sclera clear, conjunctiva pink Ears- hearing intact Oropharynx- clear Neck- supple, Lungs-  normal work of breathing Heart- Regular rate and rhythm  GI- soft  Extremities- + dependant edema  MS- diffuse atrophy Skin- no rash or lesion Psych- euthymic mood, full affect Neuro- strength and sensation are intact   Labs    Chemistry Recent Labs  Lab 06/19/20 1404 06/19/20 1618 06/20/20 0248 06/20/20 0817  NA 132* 134* 132*  --   K 7.3* 4.8 5.3*  --   CL 98 98 100  --   CO2 22 23 18*  --   GLUCOSE 159* 147* 144*  --   BUN 53* 52* 58*  --    CREATININE 2.56* 2.36* 2.02*  --   CALCIUM 9.5 9.5 9.8  --   PROT 6.0*  --   --  6.2*  ALBUMIN 2.9*  --   --  2.7*  AST 308*  --   --  186*  ALT 76*  --   --  76*  ALKPHOS 65  --   --  69  BILITOT 2.5*  --   --  1.0  GFRNONAA 23* 26* 31*  --   ANIONGAP 12 13 14   --      Hematology Recent Labs  Lab 06/20/20 0248  WBC 25.0*  RBC 4.27  HGB 14.0  HCT 42.5  MCV 99.5  MCH 32.8  MCHC 32.9  RDW 12.7  PLT 110*     Patient ID  [name] is a 86 y.o. male with a hx of CVA/TIA, hypertension and hyperlipidemia who is being seen today for the evaluation of elevated troponin at the request of Dr. Billy Fischer.   Assessment & Plan    1.  NSTEMI/ ischemic CM/ cardiogenic shock Possible recent MI, though without classic symptoms Given advanced age, a conservative approach is advised Echo shows  EF 20% with large anterior MI. He is on ASA and plavix He is on heparin also Currently not a cath candidate and MI has completed Medical therapy is limited by hypotension High dose statin once acute liver failure improves May need to add low dose dobutamine.  I think that I would stay with current treatment plan for now and wean levophed as able  2. Acute hypoxic respiratory failure Possibly due to #1 above Unable to diurese due to hypotension/ renal failure  3. Acute renal failure Consider nephrology consultation if not improved  Prognosis is very poor.  palliative care consultation is advised    Critical care time was exclusive of separate billable procedures and treating other patients.  Critial care time was spent personally by me (independant of midlevel providers) on the following activities: development of treatment plan with patient and family as well as nursing, discussions with Dr Tamala Julian, evaluation of patients response to treatment, examining patient, obtaining history from family, ordering/ reviewing treatments/ interventions, lab studies, radiographic studies, pulse ox,  and re-evaluation of patients condition.     The patient is critically ill with multiple organ systems failure and requires high complexity decision making for assessment and support, frequent evaluation and titration of therapies, application of advanced monitoring technologies and extensive interpretation of databases.   Critical care was necessary to treat or prevent immintent or life-threatening deterioration.    Total CCT spent directly with the patient today is 45 minutes  Thompson Grayer MD, [facility] 06/20/2020 1:28 PM

## 2020-06-20 NOTE — Progress Notes (Signed)
[place] for heparin Indication: chest pain/ACS  Allergies  Allergen Reactions  . Ativan [Lorazepam] Other (See Comments)    Mood change/aggressiveness  . Tape Other (See Comments)    SKIN IS VERY THIN AND WILL TEAR AND BRUISE EASILY!!    Patient Measurements: Height: 5\' 8"  (172.7 cm) Weight: 74 kg (163 lb 2.3 oz) IBW/kg (Calculated) : 68.4 Heparin Dosing Weight: 77.1kg  Vital Signs: Temp: 97.1 F (36.2 C) (11/13 1500) Temp Source: Axillary (11/13 1500) BP: 88/64 (11/13 2000) Pulse Rate: 75 (11/13 2000)  Labs: Recent Labs    06/19/20 1404 06/19/20 1525 06/19/20 1618 06/20/20 0248 06/20/20 0817 06/20/20 1935  HGB  --   --   --  14.0  --   --   HCT  --   --   --  42.5  --   --   PLT  --   --   --  110*  --   --   HEPARINUNFRC  --   --   --  0.65 0.75* 0.36  CREATININE 2.56*  --  2.36* 2.02*  --   --   TROPONINIHS >27,000* >27,000*  --   --   --   --     Estimated Creatinine Clearance: 24 mL/min (A) (by C-G formula based on SCr of 2.02 mg/dL (H)).   Medical History: Past Medical History:  Diagnosis Date  . Abnormality of gait 09/15/2015  . Cancer ([employer])   . Dyslipidemia   . Foot drop, left 09/15/2015  . Hemiparesis, aphasia, and dysphagia as late effects of stroke ([place]) 09/15/2015  . High cholesterol   . History of cerebrovascular disease    Right parietal, left cerebellar strokes, chronic  . Hypertension   . Prostate cancer ([place])    Radium seed implants  . Seizures ([place])    05/2013  . Stroke ([place])    04/20/1998  . TIA (transient ischemic attack)     Medications:  Infusions:  . heparin 900 Units/hr (06/20/20 2000)  . norepinephrine (LEVOPHED) Adult infusion 8 mcg/min (06/20/20 2000)    Assessment: 44 yom presented to the ED with SOB. Troponin elevated and now starting IV heparin.  He is not on anticoagulation PTA.  -heparin level at goal    Goal of Therapy:  Heparin level 0.3-0.7 units/ml Monitor platelets by  anticoagulation protocol: Yes   Plan:  Continue Heparin gtt at 900 units/hr Daily heparin level and CBC  Hildred Laser, PharmD Clinical Pharmacist **Pharmacist phone directory can now be found on [email or web address] ([email or web address]).  Listed under [place].

## 2020-06-20 NOTE — Consult Note (Signed)
Palliative Medicine Inpatient Consult Note  Reason for consult:  Goals of Care "Cardiogenic Shock"  HPI:  Per intake H&P --> [name] is a 86yo male with PMX significant for Stroke, seizures, prostate CA, HTN, and HLD who presented to ED for acute complaint of SOB x 3days. Per chart review it appears patient took a home COVID test that was positive that call to EMS. On arrival to EMS oxygen saturations was 74% on room air. Patient denies any other complaints including no fever, chills, chest pain, or abdominal pain.   Palliative care was asked to get involved to further discuss goals in the setting of a significant MI.   Clinical Assessment/Goals of Care: I have reviewed medical records including EPIC notes, labs and imaging, received report from bedside RN, assessed the patient who was sitting in bed on HFNC ay 15L/m.    I met with [name], [name] (daughter), and [name] (grandson) to further discuss diagnosis prognosis, GOC, EOL wishes, disposition and options.   I introduced Palliative Medicine as specialized medical care for people living with serious illness. It focuses on providing relief from the symptoms and stress of a serious illness. The goal is to improve quality of life for both the patient and the family. 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   Monterrio is from [place], [place] - he lives in the [place] area. He is married and has been wed to his wife, Inez Catalina for over sixty-six years. They share two children together one of whom is deceased. He has three grandchildren. He gets joy out of remaining active. He is a man of faith and practices with in the Protestant denomination.  Prior to hospitalization patients mobility was fair. He states that he has a caregiver,  Baker Janus who supports he and his wife 24/7 with bADLs. He vocalizes being able  to use a stationary bike for 45 minutes daily.   I asked Sade what the medical team had shared with him about his present health situation. He stated that he had a "big" heart attack and his outlook was poor. His daughter, [name] stated that he only has a small percentage of his heart working and that his kidneys are failing. We further discussed cardiogenic shock and the immediate and potential long term effects of this even if survived.   A detailed discussion was had today regarding advanced directives, patient does have a living will though we do not have a copy on our record therefore we have requested for family to bring this in for formal review.  Concepts specific to code status, artifical feeding and hydration, continued IV antibiotics and rehospitalization was had.  I recommended strongly to consider DNAR/DNI code status given patients prior history and current level of cardiac and kidney function I shared that he would not do well by any means. I stated that I would worry about causing his body greater harm and trauma than true benefit. He did interpret this information though intermittently looked off in disbelief. I inquired as to how I can better help him and he shared that his whole like changed "so quickly." I was able to re-iterate these topics.  I shared that in [place] situation after suffering such a large event and only having an ejection fraction of 20% and now being in acute renal failure that it would not be unreasonable to consider hospice as I worry  his time will be short.  I described hospice as a service for patients for have a life expectancy of < 6 months. It preserves dignity and quality at the end phases of life. The focus changes from curative to symptom relief. We also talked about transitioning focus to comfort in house and what this may look like.  At this point in time [name] is not ready to make any "big" decisions. He shares that this was not expected and he  feels somewhat overwhelmed. He agrees to review the MOST form with his daughter and grandson and come to some resolution regarding code status.   Discussed the importance of continued conversation with family and their  medical providers regarding overall plan of care and treatment options, ensuring decisions are within the context of the patients values and GOCs.  Provided "Hard Choices for [employer]" booklet.   Decision Maker: Patient though if unable to make decisions for himself he would rely on his daughter, [name] [phone]  SUMMARY OF [place] - Recommended DNAR/DNI and against HD if it ever came to it - patient shared that he needs time to think about these things  MOST form provided to patient and family to review  Request for Living Will from family to help guide decisions moving forward  Ongoing support - if hospice should be elected moving forward patient use to volunteer for [facility])  We will continue to see Schon and aid in additional conversations/difficult decisions moving forward  Code Status/Advance Care Planning: FULL CODE   Palliative Prophylaxis:   Oral care, range of motion  Additional Recommendations (Limitations, Scope, Preferences):  Continue current scope of care  Psycho-social/Spiritual:   Desire for further Chaplaincy support: Yes- Protestant  Additional Recommendations: Education on chronic diease, acute MI, and hospice   Prognosis: Poor given underlying cardiac and now impaired renal function  Discharge Planning: Discharge plan is not clear  Vitals:   06/20/20 1000 06/20/20 1015  BP: (!) 62/38 105/66  Pulse: 73 71  Resp: 19 17  Temp:    SpO2: 93% 97%    Intake/Output Summary (Last 24 hours) at 06/20/2020 1030 Last data filed at 06/20/2020 1000 Gross per 24 hour  Intake 781.18 ml  Output --  Net 781.18 ml   Last Weight  Most recent update: 06/20/2020  5:15 AM   Weight   74 kg (163 lb 2.3 oz)           Gen:  Elderly M on HFNC in NAD HEENT: Dry mucous membranes CV: Regular rate and rhythm  PULM: 15LPM HFNC, (+) bilateral crackles ABD: soft/nontender EXT: (+) trace edema Neuro: Alert and oriented   PPS: 20%   This conversation/these recommendations were discussed with patient primary care team, Dr. Tamala Julian  Time In: 1200 Time Out: 1310 Total Time: 70 Greater than 50%  of this time was spent counseling and coordinating care related to the above assessment and plan.  [place] Team Team Cell Phone: [phone] Please utilize secure chat with additional questions, if there is no response within 30 minutes please call the above phone number  Palliative Medicine Team providers are available by phone from 7am to 7pm daily and can be reached through the team cell phone.  Should this patient require assistance outside of these hours, please call the patient's attending physician.

## 2020-06-20 NOTE — Consult Note (Addendum)
NAME:  Arron Tetrault, MRN:  [id number], DOB:  1928/10/21, LOS: 1 ADMISSION DATE:  06/19/2020, CONSULTATION DATE:  06/19/2020 REFERRING MD:  Dr. Billy Fischer, CHIEF COMPLAINT:  Respiratory distress   Brief History   86yo male presents to ED with complaint of SOB that began 3 days prior to admission. Tested positive for COVID by home test negative on ED sawb. PCCM asked for assistance in management   History of present illness   Tomoki Lucken is a 86yo male with PMX significant for Stroke, seizures, prostate CA, HTN, and HLD who presented to ED for acute complaint of SOB x 3days. Per chart review it appears patient took a home COVID test that was positive that call to EMS. On arrival to EMS oxygen saturations was 74% on room air. Patient denies any other complaints including no fever, chills, chest pain, or abdominal pain.   On arrival to ED vital signs significant for tachypnea with respiratory rate of 28, all other vitals within normal limits. Labwork notable for NA 132, K 7.3, glucose 159, creatinine 2.56, AST 306, ALT 76, BNP 1,749.7, LDH 1303, HS troponin > 27, 000, CRP 23.8, D-dimer 7.78, and WBC 12.8. CXR with mild cardiomegaly and cental vascular congestion. Due to the concern for NSTEMI and decompensated HF cardiology was called. Subsequently PCCM was also consulted given the concern for underlying COVID and questionable need for repeat testing.   Past Medical History  Stroke Seizures Prostate CA HTN HLD  Significant Hospital Events   Admitted 11/12 for acute SOB  Consults:  Cardiology  PCCM   Procedures:    Significant Diagnostic Tests:  CXR 11/12 < Mild cardiomegaly with probable central pulmonary vascular congestion. Mild bibasilar subsegmental atelectasis. Small left pleural effusion.  ECHO 11/12> LVEF 20-25% with severely decreased function, regional wall motion abnormalities consistent with large LAD infarction. Grade II LV diastolic dysfunction. RV systolic  funtion moderately reduced and RV enlarged. Dilated RA and LA.   Micro Data:  COVID 11/12 > Negative   Antimicrobials:     Interim history/subjective:  No events.  Resting comfortably.  Denies chest pain or SOB.  Objective   Blood pressure 96/70, pulse 73, temperature (!) 96.9 F (36.1 C), temperature source Axillary, resp. rate 16, height 5\' 8"  (1.727 m), weight 74 kg, SpO2 93 %.    FiO2 (%):  [70 %-100 %] 90 %   Intake/Output Summary (Last 24 hours) at 06/20/2020 0849 Last data filed at 06/20/2020 0600 Gross per 24 hour  Intake 705.79 ml  Output --  Net 705.79 ml   Filed Weights   06/19/20 1605 06/20/20 0500  Weight: 77.1 kg 74 kg    Examination: Constitutional: elderly man resting in bed  Eyes: clear sclera, tracking Ears, nose, mouth, and throat: MMM, trachea midline Cardiovascular: RRR, ext lukewarm, trace edema Respiratory: crackles bases, no accessory muscle sue Gastrointestinal: soft, hypoactive BS Skin: No rashes, normal turgor Neurologic: moves all 4 ext to command Psychiatric: RASS -1, poor insight  Cr slightly improved today WBC 25  Resolved Hospital Problem list     Assessment & Plan:  Completed MI with cardiogenic shock AKI question cardiorenal Acute hypoxemia related to pulmonary edema  - Awaiting cardiology input regarding trial of inotropes and diuretics, remains on minimal levophed - Continue heparin/statin/asa - Continue levophed for now titrated to MAP 65 - Strict I/O avoid nephrotoxins, trend BUN/Cr - Palliative care will be consulted  Best practice:  Diet: cardiac  Pain/Anxiety/Delirium protocol (if indicated): na VAP protocol (if indicated):  na DVT prophylaxis: hep gtt  GI prophylaxis: na Glucose control: monitor Mobility: BR Code Status: full  Family Communication: updated patient, need cardiology input before I update family further Disposition: ICU pending pressor liberation  Patient critically ill due to cardiogenic  shock Interventions to address this today pressor titration Risk of deterioration without these interventions is high  I personally spent 34 minutes providing critical care not including any separately billable procedures  Erskine Emery MD [facility] 06/20/2020 8:55 AM Personal pager: [phone] If unanswered, please page CCM On-call: [phone]

## 2020-06-21 DIAGNOSIS — I4891 Unspecified atrial fibrillation: Secondary | ICD-10-CM | POA: Diagnosis not present

## 2020-06-21 DIAGNOSIS — I5041 Acute combined systolic (congestive) and diastolic (congestive) heart failure: Secondary | ICD-10-CM | POA: Diagnosis not present

## 2020-06-21 DIAGNOSIS — I214 Non-ST elevation (NSTEMI) myocardial infarction: Secondary | ICD-10-CM | POA: Diagnosis not present

## 2020-06-21 DIAGNOSIS — R570 Cardiogenic shock: Secondary | ICD-10-CM | POA: Diagnosis not present

## 2020-06-21 LAB — CBC
HCT: 39.8 % (ref 39.0–52.0)
Hemoglobin: 13.2 g/dL (ref 13.0–17.0)
MCH: 33 pg (ref 26.0–34.0)
MCHC: 33.2 g/dL (ref 30.0–36.0)
MCV: 99.5 fL (ref 80.0–100.0)
Platelets: 114 10*3/uL — ABNORMAL LOW (ref 150–400)
RBC: 4 MIL/uL — ABNORMAL LOW (ref 4.22–5.81)
RDW: 12.4 % (ref 11.5–15.5)
WBC: 18 10*3/uL — ABNORMAL HIGH (ref 4.0–10.5)
nRBC: 1.1 % — ABNORMAL HIGH (ref 0.0–0.2)

## 2020-06-21 LAB — BASIC METABOLIC PANEL
Anion gap: 9 (ref 5–15)
BUN: 50 mg/dL — ABNORMAL HIGH (ref 8–23)
CO2: 26 mmol/L (ref 22–32)
Calcium: 9.5 mg/dL (ref 8.9–10.3)
Chloride: 100 mmol/L (ref 98–111)
Creatinine, Ser: 1.67 mg/dL — ABNORMAL HIGH (ref 0.61–1.24)
GFR, Estimated: 39 mL/min — ABNORMAL LOW (ref >60)
Glucose, Bld: 146 mg/dL — ABNORMAL HIGH (ref 70–99)
Potassium: 4.2 mmol/L (ref 3.5–5.1)
Sodium: 135 mmol/L (ref 135–145)

## 2020-06-21 LAB — HEPARIN LEVEL (UNFRACTIONATED): Heparin Unfractionated: 0.4 IU/mL (ref 0.30–0.70)

## 2020-06-21 LAB — MAGNESIUM: Magnesium: 1.8 mg/dL (ref 1.7–2.4)

## 2020-06-21 MED ORDER — ACETAMINOPHEN 325 MG PO TABS: 650.0000 mg | ORAL_TABLET | Freq: Four times a day (QID) | ORAL | Status: DC | PRN

## 2020-06-21 MED ORDER — DOCUSATE SODIUM 100 MG PO CAPS: 100.0000 mg | ORAL_CAPSULE | Freq: Two times a day (BID) | ORAL | 0 refills | Status: AC | PRN

## 2020-06-21 MED ORDER — MORPHINE SULFATE (PF) 2 MG/ML IV SOLN: 2.0000 mg | INTRAVENOUS | Status: DC | PRN

## 2020-06-21 MED ORDER — BISACODYL 10 MG RE SUPP: 10.0000 mg | Freq: Every day | RECTAL | Status: DC | PRN

## 2020-06-21 MED ORDER — ACETAMINOPHEN 650 MG RE SUPP: 650.0000 mg | RECTAL | Status: DC | PRN

## 2020-06-21 MED ORDER — ONDANSETRON HCL 4 MG/2ML IJ SOLN: 4.0000 mg | Freq: Four times a day (QID) | INTRAMUSCULAR | Status: DC | PRN

## 2020-06-21 MED ORDER — ATROPINE SULFATE 1 % OP SOLN: 2.0000 [drp] | Freq: Four times a day (QID) | OPHTHALMIC | 12 refills | Status: AC | PRN

## 2020-06-21 MED ORDER — ACETAMINOPHEN 325 MG PO TABS: 650.0000 mg | ORAL_TABLET | Freq: Four times a day (QID) | ORAL | 0 refills | Status: AC | PRN

## 2020-06-21 MED ORDER — HALOPERIDOL LACTATE 5 MG/ML IJ SOLN: 2.0000 mg | Freq: Four times a day (QID) | INTRAMUSCULAR | Status: DC | PRN

## 2020-06-21 MED ORDER — MORPHINE SULFATE 15 MG PO TABS
15.0000 mg | ORAL_TABLET | ORAL | 0 refills | Status: AC | PRN
Start: 2020-06-21 — End: 2020-06-28

## 2020-06-21 MED ORDER — MORPHINE SULFATE (PF) 2 MG/ML IV SOLN: 1.0000 mg | INTRAVENOUS | Status: DC | PRN

## 2020-06-21 MED ORDER — POLYETHYLENE GLYCOL 3350 17 G PO PACK: 17.0000 g | PACK | Freq: Every day | ORAL | 0 refills | Status: AC | PRN

## 2020-06-21 MED ORDER — LORAZEPAM 1 MG PO TABS: 1.0000 mg | ORAL_TABLET | ORAL | 0 refills | Status: AC | PRN

## 2020-06-21 MED ORDER — MAGNESIUM SULFATE 2 GM/50ML IV SOLN
2.0000 g | Freq: Once | INTRAVENOUS | Status: AC
  Administered 2020-06-21: 2 g via INTRAVENOUS
  Filled 2020-06-21: qty 50

## 2020-06-21 MED ORDER — GLYCOPYRROLATE 0.2 MG/ML IJ SOLN: 0.4000 mg | INTRAMUSCULAR | Status: DC | PRN

## 2020-06-21 NOTE — Progress Notes (Signed)
[place] for heparin Indication: chest pain/ACS  Allergies  Allergen Reactions  . Ativan [Lorazepam] Other (See Comments)    Mood change/aggressiveness  . Tape Other (See Comments)    SKIN IS VERY THIN AND WILL TEAR AND BRUISE EASILY!!    Patient Measurements: Height: 5\' 8"  (172.7 cm) Weight: 75.6 kg (166 lb 10.7 oz) IBW/kg (Calculated) : 68.4 Heparin Dosing Weight: 77.1kg  Vital Signs: Temp: 98.7 F (37.1 C) (11/14 0700) Temp Source: Axillary (11/14 0700) BP: 102/71 (11/14 0600) Pulse Rate: 82 (11/14 0600)  Labs: Recent Labs    06/19/20 1404 06/19/20 1404 06/19/20 1525 06/19/20 1618 06/20/20 0248 06/20/20 0248 06/20/20 0817 06/20/20 1935 06/21/20 0052  HGB  --   --   --   --  14.0  --   --   --  13.2  HCT  --   --   --   --  42.5  --   --   --  39.8  PLT  --   --   --   --  110*  --   --   --  114*  HEPARINUNFRC  --   --   --   --  0.65   < > 0.75* 0.36 0.40  CREATININE 2.56*   < >  --  2.36* 2.02*  --   --   --  1.67*  TROPONINIHS >27,000*  --  >27,000*  --   --   --   --   --   --    < > = values in this interval not displayed.    Estimated Creatinine Clearance: 29 mL/min (A) (by C-G formula based on SCr of 1.67 mg/dL (H)).   Medical History: Past Medical History:  Diagnosis Date  . Abnormality of gait 09/15/2015  . Cancer ([place])   . Dyslipidemia   . Foot drop, left 09/15/2015  . Hemiparesis, aphasia, and dysphagia as late effects of stroke ([place]) 09/15/2015  . High cholesterol   . History of cerebrovascular disease    Right parietal, left cerebellar strokes, chronic  . Hypertension   . Prostate cancer ([place])    Radium seed implants  . Seizures ([place])    05/2013  . Stroke ([place])    04/20/1998  . TIA (transient ischemic attack)     Medications:  Infusions:  . heparin 900 Units/hr (06/21/20 0600)  . magnesium sulfate bolus IVPB    . norepinephrine (LEVOPHED) Adult infusion 15 mcg/min (06/21/20 0600)     Assessment: 67 yom presented to the ED with SOB. Troponin elevated and now starting IV heparin.  He is not on anticoagulation PTA.  -heparin level at goal  Goal of Therapy:  Heparin level 0.3-0.7 units/ml Monitor platelets by anticoagulation protocol: Yes   Plan:  Continue Heparin gtt at 900 units/hr Daily heparin level and CBC  Alanda Slim, PharmD, [facility] Clinical Pharmacist Please see AMION for all Pharmacists' Contact Phone Numbers 06/21/2020, 9:27 AM

## 2020-06-21 NOTE — Progress Notes (Addendum)
Palliative Medicine Inpatient Follow Up Note  Reason for consult:  Goals of Care "Cardiogenic Shock"  HPI:  Per intake H&P --> [name] is a 86yo male with PMX significant for Stroke, seizures, prostate CA, HTN, and HLD who presented to ED for acute complaint of SOB x 3days. Per chart review it appears patient took a home COVID test that was positive that call to EMS. On arrival to EMS oxygen saturations was 74% on room air. Patient denies any other complaints including no fever, chills, chest pain, or abdominal pain.   Palliative care was asked to get involved to further discuss goals in the setting of a significant MI.   Today's Discussion (06/21/2020): Chart reviewed.  I met with [name] and his daughter [name] at bedside.  We again discussed his tenuous cardiac state.  We talked about how there had been some recovery of his kidney function overnight in the setting of Levophed.  We discussed that at this point in time he is getting close to maximizing Levophed dosing.  We talked about this being a form of life support and I broached the topic of if he would want to be on life support long-term.  I brought up the question of CODE STATUS again and described what a cardiopulmonary resuscitation and/or respiratory resuscitation would look like in an instance such as his given his underlying history of cardiac disease. I shared with him my concern that his overall state thereafter would be exceptionally poor.  We further discussed if he wants to continue along with aggressive measures in intensive care or if he would be more prone towards a comfort emphasis.  He was clearly overwhelmed by this conversation therefore, I offered to allow him some private time to speak with his family to determine what the best next steps would be.  I was asked later in the afternoon to come back and speak with [name] and his family as they had made a decision.  [name] shares that at this point in time he and  his daughter and his son-in-law have all spoken and determined that if his heart were to stop he would not want resuscitative efforts rather he would want to pass peacefully.  He goes on to tell me that he realizes his time will be short and he would ideally like to be at the hospice home in [place] when he passes.  The reason for this is related to the proximity to his friends and family.  I shared with [name] and his family that at this point we will transition her focus of care and comfort measures will be put into place.  I shared that once we stop the Levophed I am not sure how much time he may have with this.  He and his family do understand this.  Aggressive efforts were made to transition [name] to the hospice home this evening though he was denied by their medical director despite his cardiogenic shock due to lack of symptoms.  Patient had not been discontinued from his Levophed at that time.  Discussed the importance of continued conversation with family and their  medical providers regarding overall plan of care and treatment options, ensuring decisions are within the context of the patients values and GOCs.  Questions and concerns addressed   Objective Assessment: Vital Signs Vitals:   06/21/20 1330 06/21/20 1500  BP: 109/74   Pulse: 83   Resp: (!) 26   Temp:    SpO2: 98% 98%  Intake/Output Summary (Last 24 hours) at 06/21/2020 1721 Last data filed at 06/21/2020 0800 Gross per 24 hour  Intake 1350.29 ml  Output 600 ml  Net 750.29 ml   Last Weight  Most recent update: 06/21/2020  5:31 AM   Weight  75.6 kg (166 lb 10.7 oz)           Gen:  Elderly M on HFNC in NAD HEENT: Dry mucous membranes CV: Regular rate and rhythm  PULM: 15LPM HFNC, (+) bilateral crackles ABD: soft/nontender EXT: (+) trace edema Neuro: Alert and oriented   SUMMARY OF RECOMMENDATIONS DNAR/DNI  Living Will obtained --> will be scanned into [employer]  Referral sent to hosp[facility] of [place]  --> Have been told that this evening he will not be accepted despite his cardiogenic shock and symptoms which are sure to develop when levophed is stopped  Liberalize visitation in accordance with end-of-life care patients  Comfort medications ordered as per [facility]  Plan for transfer to 6N when a bed is available  Time Spent: 60 Greater than 50% of the time was spent in counseling and coordination of care ______________________________________________________________________________________ [facility] Team Team Cell Phone: [phone] Please utilize secure chat with additional questions, if there is no response within 30 minutes please call the above phone number  Palliative Medicine Team providers are available by phone from 7am to 7pm daily and can be reached through the team cell phone.  Should this patient require assistance outside of these hours, please call the patient's attending physician.

## 2020-06-21 NOTE — Progress Notes (Signed)
NAME:  [name], MRN:  [id number], DOB:  Sep 06, 1928, LOS: 2 ADMISSION DATE:  06/19/2020, CONSULTATION DATE:  06/19/2020 REFERRING MD:  Dr. Billy Fischer, CHIEF COMPLAINT:  Respiratory distress   Brief History   86yo male presents to ED with complaint of SOB that began 3 days prior to admission. Tested positive for COVID by home test negative on ED sawb. PCCM asked for assistance in management   History of present illness   [name] is a 86yo male with PMX significant for Stroke, seizures, prostate CA, HTN, and HLD who presented to ED for acute complaint of SOB x 3days. Per chart review it appears patient took a home COVID test that was positive that call to EMS. On arrival to EMS oxygen saturations was 74% on room air. Patient denies any other complaints including no fever, chills, chest pain, or abdominal pain.   On arrival to ED vital signs significant for tachypnea with respiratory rate of 28, all other vitals within normal limits. Labwork notable for NA 132, K 7.3, glucose 159, creatinine 2.56, AST 306, ALT 76, BNP 1,749.7, LDH 1303, HS troponin > 27, 000, CRP 23.8, D-dimer 7.78, and WBC 12.8. CXR with mild cardiomegaly and cental vascular congestion. Due to the concern for NSTEMI and decompensated HF cardiology was called. Subsequently PCCM was also consulted given the concern for underlying COVID and questionable need for repeat testing.   Past Medical History  Stroke Seizures Prostate CA HTN HLD  Significant Hospital Events   Admitted 11/12 for acute SOB  Consults:  Cardiology  PCCM   Procedures:    Significant Diagnostic Tests:  CXR 11/12 < Mild cardiomegaly with probable central pulmonary vascular congestion. Mild bibasilar subsegmental atelectasis. Small left pleural effusion.  ECHO 11/12> LVEF 20-25% with severely decreased function, regional wall motion abnormalities consistent with large LAD infarction. Grade II LV diastolic dysfunction. RV systolic  funtion moderately reduced and RV enlarged. Dilated RA and LA.   Micro Data:  COVID 11/12 > Negative   Antimicrobials:     Interim history/subjective:  No events. Pressor requirements increased. Daughter did not want patient to get CVC. Is making some urine.  Objective   Blood pressure 102/71, pulse 82, temperature 98.7 F (37.1 C), temperature source Axillary, resp. rate (!) 26, height 5\' 8"  (1.727 m), weight 75.6 kg, SpO2 96 %.    FiO2 (%):  [70 %-90 %] 70 %   Intake/Output Summary (Last 24 hours) at 06/21/2020 0828 Last data filed at 06/21/2020 0600 Gross per 24 hour  Intake 1424.39 ml  Output 600 ml  Net 824.39 ml   Filed Weights   06/19/20 1605 06/20/20 0500 06/21/20 0500  Weight: 77.1 kg 74 kg 75.6 kg    Examination: Constitutional: elderly man in NAD  Eyes: tracking with eyes, pupils equal Ears, nose, mouth, and throat: MMM, trachea midline Cardiovascular: RRR, ext warm Respiratory: Scattered crackles, no accessory muscle use Gastrointestinal: Soft, hypoactive BS Skin: No rashes, normal turgor Neurologic: moves all 4 ext to command Psychiatric: answering question appropriately.  Cr slightly improved today WBC 25  Resolved Hospital Problem list     Assessment & Plan:  Completed MI with cardiogenic shock, worsened AKI question cardiorenal, improved Acute hypoxemia related to pulmonary edema  Tough spot, kidneys appear to be bouncing back but heart weakening, will discuss with daughter what to do here other than watchful waiting.  A PICC to get [place] an SVO2 and safer pressor administration may be good compromise but will ask her thoughts.  Patient is overwhelmed at current situation.  Best practice:  Diet: cardiac  Pain/Anxiety/Delirium protocol (if indicated): na VAP protocol (if indicated): na DVT prophylaxis: hep gtt  GI prophylaxis: na Glucose control: monitor Mobility: BR Code Status: full  Family Communication: updated patient, awaiting family  to come in Disposition: ICU pending pressor liberation  Patient critically ill due to cardiogenic shock Interventions to address this today pressor titration Risk of deterioration without these interventions is high  I personally spent 37 minutes providing critical care not including any separately billable procedures  Erskine Emery MD [facility] 06/21/2020 8:28 AM Personal pager: [phone] If unanswered, please page CCM On-call: [phone]

## 2020-06-21 NOTE — Progress Notes (Signed)
Called by Elink to place TLC for increasing vasoactive support requirements.  Pt is currently on Levophed at 75mcg/kg/min but remains hypotensive. Called the pts daughter to update her and get consent for the TLC.  We discussed options of care from placing the TLC to continuing with the peripheral IVs.  She does not want to put him through more procedures.  They are leaning towards comfort measures.  Will continue using Levophed through a peripheral and will update the family if he continues to decline.  She is aware that he has a poor prognosis.

## 2020-06-21 NOTE — Progress Notes (Signed)
Progress Note   Subjective   Doing well today, the patient denies CP or SOB.  No new concerns  Inpatient Medications    Scheduled Meds: . aspirin  81 mg Oral Daily  . Chlorhexidine Gluconate Cloth  6 each Topical Daily  . clopidogrel  75 mg Oral Daily  . levETIRAcetam  500 mg Oral BID  . levothyroxine  12.5 mcg Oral QAC breakfast  . mouth rinse  15 mL Mouth Rinse BID  . sodium chloride flush  3 mL Intravenous Q12H   Continuous Infusions: . heparin 900 Units/hr (06/21/20 0800)  . magnesium sulfate bolus IVPB    . norepinephrine (LEVOPHED) Adult infusion 12 mcg/min (06/21/20 0800)   PRN Meds: acetaminophen **OR** acetaminophen, docusate sodium, polyethylene glycol   Vital Signs    Vitals:   06/21/20 1015 06/21/20 1030 06/21/20 1045 06/21/20 1100  BP:  94/76 106/79 (!) 88/67  Pulse: 96 93 (!) 103 81  Resp: (!) 30 (!) 29 (!) 27 (!) 29  Temp:      TempSrc:      SpO2: 98% 98% 98% 98%  Weight:      Height:        Intake/Output Summary (Last 24 hours) at 06/21/2020 1105 Last data filed at 06/21/2020 0800 Gross per 24 hour  Intake 1491.88 ml  Output 600 ml  Net 891.88 ml   Filed Weights   06/19/20 1605 06/20/20 0500 06/21/20 0500  Weight: 77.1 kg 74 kg 75.6 kg    Telemetry    Sinus,  He did have afib earlier today with RVR which has converted back to sinus - Personally Reviewed  Physical Exam   GEN- The patient is elderly and frail appearing, alert and oriented x 3 today.   Head- normocephalic, atraumatic Eyes-  Sclera clear, conjunctiva pink Ears- hearing intact Oropharynx- clear Neck- supple, Lungs-  normal work of breathing Heart- Regular rate and rhythm  GI- soft  Extremities- + dependant edema  MS- diffuse  atrophy Skin- no rash or lesion Psych- euthymic mood, full affect Neuro- strength and sensation are intact   Labs    Chemistry Recent Labs  Lab 06/19/20 1404 06/19/20 1404 06/19/20 1618 06/20/20 0248 06/20/20 0817 06/21/20 0052    NA 132*   < > 134* 132*  --  135  K 7.3*   < > 4.8 5.3*  --  4.2  CL 98   < > 98 100  --  100  CO2 22   < > 23 18*  --  26  GLUCOSE 159*   < > 147* 144*  --  146*  BUN 53*   < > 52* 58*  --  50*  CREATININE 2.56*   < > 2.36* 2.02*  --  1.67*  CALCIUM 9.5   < > 9.5 9.8  --  9.5  PROT 6.0*  --   --   --  6.2*  --   ALBUMIN 2.9*  --   --   --  2.7*  --   AST 308*  --   --   --  186*  --   ALT 76*  --   --   --  76*  --   ALKPHOS 65  --   --   --  69  --   BILITOT 2.5*  --   --   --  1.0  --   GFRNONAA 23*   < > 26* 31*  --  39*  ANIONGAP 12   < >  13 14  --  9   < > = values in this interval not displayed.     Hematology Recent Labs  Lab 06/20/20 0248 06/21/20 0052  WBC 25.0* 18.0*  RBC 4.27 4.00*  HGB 14.0 13.2  HCT 42.5 39.8  MCV 99.5 99.5  MCH 32.8 33.0  MCHC 32.9 33.2  RDW 12.7 12.4  PLT 110* 114*     Patient ID  Micheal Bumgarneris a 86 y.o.malewith a hx of CVA/TIA, hypertension and hyperlipidemiawho is being seen today for the evaluation of elevated troponinat the request of Dr. Billy Fischer.  Assessment & Plan    1.  NSTEMI with possible recent large anterior MI and cardiogenic shock Conservative measures are advised He is on ASA, plavix and heparin Not a candidate for cath and MI has completed Medical therapy is limited by hypotension Statin was liver function improves I agree with Dr Tamala Julian that CoOx could be helpful.  We could consider low dose dobutamine, however, this may worsen afib.  Continue current course for now.  2. Acute hypoxic respiratory failure Likely due to above Continues to improve slowly  3. Acute renal failure Much improved Would avoid cath at this time  4. afib New diagnosis He is on heparin drip Likely due to acute medical illness Continue to follow  Given advanced age and clinic picture, prognosis is guarded.  Conservative measures are best.  Palliative care is following.  Critical care time was exclusive of separate  billable procedures and treating other patients.  Critial care time was spent personally by me (independant of midlevel providers) on the following activities: development of treatment plan with patient and/or surrogate as well as nursing, discussions with consultants, evaluation of patients response to treatment, examining patient, obtaining history from patient or surrogate, ordering/ reviewing treatments/ interventions, lab studies, radiographic studies, pulse ox, and re-evaluation of patients condition.     The patient is critically ill with multiple organ systems failure and requires high complexity decision making for assessment and support, frequent evaluation and titration of therapies, application of advanced monitoring technologies and extensive interpretation of databases.   Critical care was necessary to treat or prevent immintent or life-threatening deterioration.    Total CCT spent directly with the patient today is 40 minutes  Thompson Grayer MD, [facility] 06/21/2020 11:12 AM

## 2020-06-21 NOTE — Progress Notes (Signed)
PT unable to maintain bp on current PIV levo limits. Dr. Earlie Server at bedside. Spoke to patient and daughter on the phone and elected not to place a CVC at this time. PIV levo max increased to 20 mcg/min.

## 2020-06-21 NOTE — Discharge Summary (Addendum)
Physician Discharge Summary  Patient ID: [name] MRN: [id number] DOB/AGE: March 03, 1929 86 y.o.  Admit date: 06/19/2020 Discharge date: 06/21/2020  Admission Diagnoses:  Discharge Diagnoses:  Principal Problem:   NSTEMI (non-ST elevated myocardial infarction) [facility]) Active Problems:   Seizure disorder ([place])   Acute combined systolic and diastolic congestive heart failure (HCC)   Acute respiratory failure with hypoxia (HCC)   AKI (acute kidney injury) ([place])   Elevated LFTs   Hyperkalemia   Hypothyroidism   Cardiogenic shock ([facility])   Palliative care by specialist   Goals of care, counseling/discussion   DNR (do not resuscitate) discussion   Discharged Condition: poor  Hospital Course:  Patient was admitted with shortness of breath found to have completed large myocardial infarct.  His blood pressure continued to deteriorate and after family discussion he elected to go to an inpatient hospice facility.   Levophed should be turned off before patient is transported.  Family and patient understand he may pass in transport.  Consults: Palliative Care  Significant Diagnostic Studies: cardiac graphics: Echocardiogram: Severely reduced EF with wall motion abnormalities consistent with LAD infarct   Discharge Exam: Blood pressure 109/74, pulse 83, temperature 98.3 F (36.8 C), temperature source Oral, resp. rate (!) 26, height 5\' 8"  (1.727 m), weight 75.6 kg, SpO2 98 %. Constitutional: elderly man in NAD  Eyes: tracking with eyes, pupils equal Ears, nose, mouth, and throat: MMM, trachea midline Cardiovascular: RRR, ext warm Respiratory: Scattered crackles, no accessory muscle use Gastrointestinal: Soft, hypoactive BS Skin: No rashes, normal turgor Neurologic: moves all 4 ext to command Psychiatric: answering question appropriately.  Disposition: Inpatient hospice in [place]   Allergies as of 06/21/2020      Reactions   Ativan [lorazepam] Other (See Comments)   Mood  change/aggressiveness   Tape Other (See Comments)   SKIN IS VERY THIN AND WILL TEAR AND BRUISE EASILY!!      Medication List    STOP taking these medications   aspirin EC 81 MG tablet   Biotin 1000 MCG tablet   clopidogrel 75 MG tablet Commonly known as: PLAVIX   Elderberry Zinc/Vit C/Immune Lozg   finasteride 5 MG tablet Commonly known as: PROSCAR   fluticasone 50 MCG/ACT nasal spray Commonly known as: FLONASE   levETIRAcetam 500 MG tablet Commonly known as: KEPPRA   levofloxacin 500 MG tablet Commonly known as: LEVAQUIN   Levoxyl 25 MCG tablet Generic drug: levothyroxine   loratadine 10 MG tablet Commonly known as: CLARITIN   losartan 50 MG tablet Commonly known as: COZAAR   Mucinex DM 30-600 MG Tb12   multivitamin with minerals Tabs tablet   pantoprazole 40 MG tablet Commonly known as: PROTONIX   pravastatin 40 MG tablet Commonly known as: PRAVACHOL   predniSONE 10 MG tablet Commonly known as: DELTASONE   promethazine 12.5 MG tablet Commonly known as: PHENERGAN   Tart Cherry 1200 MG Caps   vitamin B-12 500 MCG tablet Commonly known as: CYANOCOBALAMIN   vitamin C 250 MG tablet Commonly known as: ASCORBIC ACID   Vitamin D-3 25 MCG (1000 UT) Caps     TAKE these medications   acetaminophen 325 MG tablet Commonly known as: TYLENOL Take 2 tablets (650 mg total) by mouth every 6 (six) hours as needed for mild pain (or Fever >/= 101). What changed:   how much to take  reasons to take this   atropine 1 % ophthalmic solution Place 2 drops under the tongue every 6 (six) hours as needed (dry mouth).  docusate sodium 100 MG capsule Commonly known as: COLACE Take 1 capsule (100 mg total) by mouth 2 (two) times daily as needed for mild constipation.   LORazepam 1 MG tablet Commonly known as: Ativan Take 1 tablet (1 mg total) by mouth every 4 (four) hours as needed for up to 10 days for anxiety.   morphine 15 MG tablet Commonly known as:  MSIR Take 1 tablet (15 mg total) by mouth every 4 (four) hours as needed for up to 7 days for severe pain.   polyethylene glycol 17 g packet Commonly known as: MIRALAX / GLYCOLAX Take 17 g by mouth daily as needed for moderate constipation.        Signed: Candee Furbish 06/21/2020, 4:36 PM

## 2020-06-22 DIAGNOSIS — L8990 Pressure ulcer of unspecified site, unspecified stage: Secondary | ICD-10-CM | POA: Insufficient documentation

## 2020-06-22 DIAGNOSIS — I214 Non-ST elevation (NSTEMI) myocardial infarction: Secondary | ICD-10-CM | POA: Diagnosis not present

## 2020-06-22 MED ORDER — WHITE PETROLATUM EX OINT
TOPICAL_OINTMENT | CUTANEOUS | Status: AC
  Filled 2020-06-22: qty 28.35

## 2020-06-22 NOTE — Progress Notes (Signed)
NAME:  [name], MRN:  [id number], DOB:  [birth year], LOS: 3 ADMISSION DATE:  06/19/2020, CONSULTATION DATE:  06/19/2020 REFERRING MD:  Dr. Billy Fischer, CHIEF COMPLAINT:  Respiratory distress   Brief History   86yo male presents to ED with complaint of SOB that began 3 days prior to admission. Tested positive for COVID by home test negative on ED sawb. PCCM asked for assistance in management   History of present illness   Stavros Cail is a 86yo male with PMX significant for Stroke, seizures, prostate CA, HTN, and HLD who presented to ED for acute complaint of SOB x 3days. Per chart review it appears patient took a home COVID test that was positive that call to EMS. On arrival to EMS oxygen saturations was 74% on room air. Patient denies any other complaints including no fever, chills, chest pain, or abdominal pain.   On arrival to ED vital signs significant for tachypnea with respiratory rate of 28, all other vitals within normal limits. Labwork notable for NA 132, K 7.3, glucose 159, creatinine 2.56, AST 306, ALT 76, BNP 1,749.7, LDH 1303, HS troponin > 27, 000, CRP 23.8, D-dimer 7.78, and WBC 12.8. CXR with mild cardiomegaly and cental vascular congestion. Due to the concern for NSTEMI and decompensated HF cardiology was called. Subsequently PCCM was also consulted given the concern for underlying COVID and questionable need for repeat testing.   Past Medical History  Stroke Seizures Prostate CA HTN HLD  Significant Hospital Events   Admitted 11/12 for acute SOB  Consults:  Cardiology  PCCM  Palliative  Procedures:    Significant Diagnostic Tests:  CXR 11/12 < Mild cardiomegaly with probable central pulmonary vascular congestion. Mild bibasilar subsegmental atelectasis. Small left pleural effusion.  ECHO 11/12> LVEF 20-25% with severely decreased function, regional wall motion abnormalities consistent with large LAD infarction. Grade II LV diastolic dysfunction. RV  systolic funtion moderately reduced and RV enlarged. Dilated RA and LA.   Micro Data:  COVID 11/12 > Negative   Antimicrobials:     Interim history/subjective:  No events or concerns overnight. Levophed weaned off. Comfort care awaiting transfer.   Objective   Blood pressure (!) 68/55, pulse 74, temperature 98.6 F (37 C), temperature source Oral, resp. rate 17, height 5\' 8"  (1.727 m), weight 75.6 kg, SpO2 95 %.    FiO2 (%):  [60 %] 60 %   Intake/Output Summary (Last 24 hours) at 06/22/2020 0819 Last data filed at 06/22/2020 0600 Gross per 24 hour  Intake 1053.14 ml  Output 1000 ml  Net 53.14 ml   Filed Weights   06/19/20 1605 06/20/20 0500 06/21/20 0500  Weight: 77.1 kg 74 kg 75.6 kg    Examination: Constitutional: elderly man in NAD  Eyes: tracking with eyes, pupils equal Ears, nose, mouth, and throat: MMM, trachea midline Cardiovascular: RRR, ext warm Respiratory: Scattered crackles, no accessory muscle use Gastrointestinal: Soft, hypoactive BS Skin: No rashes, normal turgor Neurologic: moves all 4 ext to command Psychiatric: answering question appropriately.  Resolved Hospital Problem list     Assessment & Plan:  Completed MI with cardiogenic shock, worsened AKI question cardiorenal, improved Acute hypoxemia related to pulmonary edema Comfort Care awaiting inpatient hospice transfer   Unable to transfer yesterday evening, levophed weaned last night. Transfer pending  Continue supportive measures   Palliative care consulted, appreciate recommendations   Best practice:  Diet: cardiac  Pain/Anxiety/Delirium protocol (if indicated): na VAP protocol (if indicated): na DVT prophylaxis: na GI prophylaxis: na Glucose control:  monitor Mobility: BR Code Status: DNR Family Communication: updated patient and family at bedside 11/15 Disposition: comfort care  Marty Heck, DO 06/22/2020, 8:20 AM Pager: [phone]

## 2020-06-22 NOTE — Plan of Care (Signed)

## 2020-06-22 NOTE — Progress Notes (Signed)
Report given to Dewaine Oats, RN with [facility] @ [phone]

## 2020-06-22 NOTE — TOC Transition Note (Signed)
Transition of Care [facility]) - CM/SW Discharge Note   Patient Details  Name: [name] MRN: [id number] Date of Birth: 19-Feb-1929  Transition of Care [facility]) CM/SW Contact:  Ninfa Meeker, RN Phone Number: 06/22/2020, 10:15 AM   Discharge to: [place] Family Notified: Daughter- [name]  [phone] Transport by: Corey Harold Time:  approximately 4:00pm  Clinical Narrative:   Case manager spoke with Roxanne from [facility] concerning patient being able to go to their facility. Family is aware and in agreement, will come to hospital to say goodbyes today and requested transport for 3-4:00pm.Case manager contacted [facility] and scheduled a 4:00pm pickup time.   Discharge is written, DNR needs to be on chart.   Bedside RN to call report to: [facility] -[phone]. Please notify patient's daughter when Corey Harold has picked patient up.    Final next level of care: [place] Barriers to Discharge: No Barriers Identified   Patient Goals and CMS Choice     Choice offered to / list presented to : Adult Children  Discharge Placement                Patient to be transferred to facility by: [place] Name of family member notified: [name] - daughter Patient and family notified of of transfer: 06/22/20  Discharge Plan and Services                DME Arranged: N/A DME Agency: NA       HH Arranged: NA HH Agency: NA        Social Determinants of Health (SDOH) Interventions     Readmission Risk Interventions No flowsheet data found.

## 2020-06-22 NOTE — Consult Note (Signed)
[facility]: Pt has been approved for transfer to [facility] for GIP level of care. I have spoken to family to make aware and they are requesting transfer later this afternoon around 3-4 to allow time for family to say goodbyes before transfer occurs. Family will sign paperwork at our [facility] prior to transfer. Will update and set up transport when family calls back with time. Please call with any questions.  [phone]

## 2020-06-22 NOTE — Progress Notes (Signed)
Pt transported with [facility] to [facility]. Pt.'s daughter at bedside. All belongings sent with patient and family.

## 2020-06-22 NOTE — Progress Notes (Signed)
[facility] will sign off.   Medication Recommendations:  None Other recommendations (labs, testing, etc):  Agree with plan. Follow up as an outpatient:  None

## 2020-06-24 LAB — CULTURE, BLOOD (ROUTINE X 2): Culture: NO GROWTH

## 2020-07-08 DEATH — deceased
